# Patient Record
Sex: Male | Born: 1935 | Race: White | Hispanic: No | Smoking: Never smoker
Health system: Southern US, Community
[De-identification: ages and names within clinical notes are randomized; demographics above are authoritative.]

## PROBLEM LIST (undated history)

## (undated) DIAGNOSIS — F419 Anxiety disorder, unspecified: Secondary | ICD-10-CM

## (undated) DIAGNOSIS — I2510 Atherosclerotic heart disease of native coronary artery without angina pectoris: Secondary | ICD-10-CM

## (undated) DIAGNOSIS — M6281 Muscle weakness (generalized): Secondary | ICD-10-CM

## (undated) DIAGNOSIS — F329 Major depressive disorder, single episode, unspecified: Secondary | ICD-10-CM

## (undated) DIAGNOSIS — E119 Type 2 diabetes mellitus without complications: Secondary | ICD-10-CM

## (undated) DIAGNOSIS — I219 Acute myocardial infarction, unspecified: Secondary | ICD-10-CM

## (undated) DIAGNOSIS — F32A Depression, unspecified: Secondary | ICD-10-CM

## (undated) DIAGNOSIS — I4891 Unspecified atrial fibrillation: Secondary | ICD-10-CM

## (undated) DIAGNOSIS — I509 Heart failure, unspecified: Secondary | ICD-10-CM

## (undated) DIAGNOSIS — C4490 Unspecified malignant neoplasm of skin, unspecified: Secondary | ICD-10-CM

## (undated) DIAGNOSIS — N320 Bladder-neck obstruction: Secondary | ICD-10-CM

## (undated) DIAGNOSIS — I429 Cardiomyopathy, unspecified: Secondary | ICD-10-CM

## (undated) DIAGNOSIS — N189 Chronic kidney disease, unspecified: Secondary | ICD-10-CM

## (undated) HISTORY — PX: CORONARY ANGIOPLASTY: SHX604

## (undated) HISTORY — PX: CATARACT EXTRACTION: SUR2

## (undated) HISTORY — DX: Depression, unspecified: F32.A

## (undated) HISTORY — DX: Muscle weakness (generalized): M62.81

## (undated) HISTORY — DX: Unspecified malignant neoplasm of skin, unspecified: C44.90

## (undated) HISTORY — DX: Unspecified atrial fibrillation: I48.91

## (undated) HISTORY — DX: Major depressive disorder, single episode, unspecified: F32.9

## (undated) HISTORY — PX: LAMINECTOMY: SHX219

## (undated) HISTORY — DX: Heart failure, unspecified: I50.9

## (undated) HISTORY — PX: SKIN CANCER EXCISION: SHX779

## (undated) HISTORY — DX: Bladder-neck obstruction: N32.0

## (undated) HISTORY — DX: Chronic kidney disease, unspecified: N18.9

## (undated) HISTORY — DX: Acute myocardial infarction, unspecified: I21.9

## (undated) HISTORY — PX: APPENDECTOMY: SHX54

## (undated) HISTORY — DX: Anxiety disorder, unspecified: F41.9

## (undated) HISTORY — DX: Atherosclerotic heart disease of native coronary artery without angina pectoris: I25.10

## (undated) HISTORY — DX: Cardiomyopathy, unspecified: I42.9

## (undated) HISTORY — PX: SUPRAPUBIC CATHETER PLACEMENT: SHX2473

---

## 1987-03-27 HISTORY — PX: CORONARY ARTERY BYPASS GRAFT: SHX141

## 2004-05-12 ENCOUNTER — Inpatient Hospital Stay: Payer: Self-pay | Admitting: Surgery

## 2004-05-12 ENCOUNTER — Other Ambulatory Visit: Payer: Self-pay

## 2004-05-15 ENCOUNTER — Other Ambulatory Visit: Payer: Self-pay

## 2004-06-02 ENCOUNTER — Inpatient Hospital Stay: Payer: Self-pay | Admitting: Internal Medicine

## 2004-06-05 ENCOUNTER — Emergency Department: Payer: Self-pay | Admitting: Emergency Medicine

## 2004-06-05 ENCOUNTER — Other Ambulatory Visit: Payer: Self-pay

## 2005-10-08 ENCOUNTER — Ambulatory Visit: Payer: Self-pay | Admitting: Internal Medicine

## 2008-02-17 ENCOUNTER — Ambulatory Visit: Payer: Self-pay | Admitting: Ophthalmology

## 2008-03-01 ENCOUNTER — Ambulatory Visit: Payer: Self-pay | Admitting: Ophthalmology

## 2008-10-29 ENCOUNTER — Ambulatory Visit: Payer: Self-pay | Admitting: Physician Assistant

## 2008-11-05 ENCOUNTER — Ambulatory Visit: Payer: Self-pay | Admitting: Internal Medicine

## 2008-11-10 ENCOUNTER — Ambulatory Visit: Payer: Self-pay | Admitting: Internal Medicine

## 2009-05-09 ENCOUNTER — Inpatient Hospital Stay: Payer: Self-pay | Admitting: Internal Medicine

## 2009-05-27 ENCOUNTER — Ambulatory Visit: Payer: Self-pay | Admitting: Internal Medicine

## 2010-02-07 ENCOUNTER — Inpatient Hospital Stay: Payer: Self-pay | Admitting: Internal Medicine

## 2010-02-14 ENCOUNTER — Emergency Department: Payer: Self-pay

## 2010-12-04 ENCOUNTER — Ambulatory Visit: Payer: Self-pay | Admitting: Internal Medicine

## 2012-08-20 DIAGNOSIS — N183 Chronic kidney disease, stage 3 (moderate): Secondary | ICD-10-CM

## 2013-06-28 ENCOUNTER — Emergency Department: Payer: Self-pay | Admitting: Emergency Medicine

## 2013-06-28 LAB — CBC
HCT: 34.9 % — ABNORMAL LOW (ref 40.0–52.0)
HGB: 11.5 g/dL — AB (ref 13.0–18.0)
MCH: 30.6 pg (ref 26.0–34.0)
MCHC: 32.8 g/dL (ref 32.0–36.0)
MCV: 93 fL (ref 80–100)
Platelet: 169 10*3/uL (ref 150–440)
RBC: 3.74 10*6/uL — AB (ref 4.40–5.90)
RDW: 15.4 % — ABNORMAL HIGH (ref 11.5–14.5)
WBC: 6 10*3/uL (ref 3.8–10.6)

## 2013-06-28 LAB — URINALYSIS, COMPLETE
Bacteria: NONE SEEN
Bilirubin,UR: NEGATIVE
KETONE: NEGATIVE
LEUKOCYTE ESTERASE: NEGATIVE
Nitrite: NEGATIVE
PH: 5 (ref 4.5–8.0)
Protein: 30
SPECIFIC GRAVITY: 1.012 (ref 1.003–1.030)
SQUAMOUS EPITHELIAL: NONE SEEN
WBC UR: 7 /HPF (ref 0–5)

## 2013-06-28 LAB — BASIC METABOLIC PANEL
Anion Gap: 5 — ABNORMAL LOW (ref 7–16)
BUN: 38 mg/dL — ABNORMAL HIGH (ref 7–18)
CHLORIDE: 107 mmol/L (ref 98–107)
CREATININE: 1.34 mg/dL — AB (ref 0.60–1.30)
Calcium, Total: 9.6 mg/dL (ref 8.5–10.1)
Co2: 26 mmol/L (ref 21–32)
EGFR (African American): 54 — ABNORMAL LOW
EGFR (Non-African Amer.): 46 — ABNORMAL LOW
GLUCOSE: 190 mg/dL — AB (ref 65–99)
Osmolality: 290 (ref 275–301)
Potassium: 5.6 mmol/L — ABNORMAL HIGH (ref 3.5–5.1)
Sodium: 138 mmol/L (ref 136–145)

## 2013-07-26 DIAGNOSIS — E785 Hyperlipidemia, unspecified: Secondary | ICD-10-CM | POA: Insufficient documentation

## 2013-08-22 DIAGNOSIS — I4891 Unspecified atrial fibrillation: Secondary | ICD-10-CM | POA: Insufficient documentation

## 2013-08-22 DIAGNOSIS — I2510 Atherosclerotic heart disease of native coronary artery without angina pectoris: Secondary | ICD-10-CM | POA: Insufficient documentation

## 2013-08-22 DIAGNOSIS — I10 Essential (primary) hypertension: Secondary | ICD-10-CM | POA: Insufficient documentation

## 2013-10-30 DIAGNOSIS — E1142 Type 2 diabetes mellitus with diabetic polyneuropathy: Secondary | ICD-10-CM | POA: Insufficient documentation

## 2013-11-24 ENCOUNTER — Inpatient Hospital Stay: Payer: Self-pay | Admitting: Internal Medicine

## 2013-11-24 LAB — PHOSPHORUS: Phosphorus: 1.6 mg/dL — ABNORMAL LOW (ref 2.5–4.9)

## 2013-11-24 LAB — URINALYSIS, COMPLETE
BACTERIA: NONE SEEN
Bilirubin,UR: NEGATIVE
Leukocyte Esterase: NEGATIVE
Nitrite: NEGATIVE
Ph: 5 (ref 4.5–8.0)
SQUAMOUS EPITHELIAL: NONE SEEN
Specific Gravity: 1.013 (ref 1.003–1.030)
WBC UR: 2 /HPF (ref 0–5)

## 2013-11-24 LAB — HEPATIC FUNCTION PANEL A (ARMC)
Albumin: 3 g/dL — ABNORMAL LOW (ref 3.4–5.0)
Alkaline Phosphatase: 67 U/L
Bilirubin, Direct: 0.1 mg/dL (ref 0.00–0.20)
Bilirubin,Total: 0.4 mg/dL (ref 0.2–1.0)
SGOT(AST): 19 U/L (ref 15–37)
SGPT (ALT): 22 U/L
Total Protein: 6.3 g/dL — ABNORMAL LOW (ref 6.4–8.2)

## 2013-11-24 LAB — CBC
HCT: 33.3 % — AB (ref 40.0–52.0)
HGB: 11 g/dL — AB (ref 13.0–18.0)
MCH: 30.7 pg (ref 26.0–34.0)
MCHC: 33.1 g/dL (ref 32.0–36.0)
MCV: 93 fL (ref 80–100)
Platelet: 136 10*3/uL — ABNORMAL LOW (ref 150–440)
RBC: 3.59 10*6/uL — AB (ref 4.40–5.90)
RDW: 14.4 % (ref 11.5–14.5)
WBC: 6.3 10*3/uL (ref 3.8–10.6)

## 2013-11-24 LAB — BASIC METABOLIC PANEL
ANION GAP: 10 (ref 7–16)
BUN: 43 mg/dL — ABNORMAL HIGH (ref 7–18)
CHLORIDE: 107 mmol/L (ref 98–107)
CO2: 18 mmol/L — AB (ref 21–32)
CREATININE: 1.59 mg/dL — AB (ref 0.60–1.30)
Calcium, Total: 9.2 mg/dL (ref 8.5–10.1)
EGFR (African American): 43 — ABNORMAL LOW
GFR CALC NON AF AMER: 37 — AB
GLUCOSE: 192 mg/dL — AB (ref 65–99)
Osmolality: 286 (ref 275–301)
Potassium: 5.4 mmol/L — ABNORMAL HIGH (ref 3.5–5.1)
Sodium: 135 mmol/L — ABNORMAL LOW (ref 136–145)

## 2013-11-24 LAB — TROPONIN I: TROPONIN-I: 0.03 ng/mL

## 2013-11-24 LAB — PROTIME-INR
INR: 1.2
PROTHROMBIN TIME: 14.6 s (ref 11.5–14.7)

## 2013-11-24 LAB — MAGNESIUM: MAGNESIUM: 1.5 mg/dL — AB

## 2013-11-25 LAB — CLOSTRIDIUM DIFFICILE(ARMC)

## 2013-11-25 LAB — BASIC METABOLIC PANEL
Anion Gap: 7 (ref 7–16)
BUN: 35 mg/dL — AB (ref 7–18)
CO2: 21 mmol/L (ref 21–32)
Calcium, Total: 7.8 mg/dL — ABNORMAL LOW (ref 8.5–10.1)
Chloride: 111 mmol/L — ABNORMAL HIGH (ref 98–107)
Creatinine: 1.4 mg/dL — ABNORMAL HIGH (ref 0.60–1.30)
GFR CALC AF AMER: 51 — AB
GFR CALC NON AF AMER: 44 — AB
Glucose: 163 mg/dL — ABNORMAL HIGH (ref 65–99)
OSMOLALITY: 289 (ref 275–301)
POTASSIUM: 5.1 mmol/L (ref 3.5–5.1)
SODIUM: 139 mmol/L (ref 136–145)

## 2013-11-26 LAB — CBC WITH DIFFERENTIAL/PLATELET
Basophil #: 0 10*3/uL (ref 0.0–0.1)
Basophil %: 0.4 %
EOS PCT: 3.3 %
Eosinophil #: 0.2 10*3/uL (ref 0.0–0.7)
HCT: 32.7 % — AB (ref 40.0–52.0)
HGB: 10.3 g/dL — ABNORMAL LOW (ref 13.0–18.0)
LYMPHS ABS: 0.8 10*3/uL — AB (ref 1.0–3.6)
LYMPHS PCT: 17.7 %
MCH: 29.7 pg (ref 26.0–34.0)
MCHC: 31.7 g/dL — ABNORMAL LOW (ref 32.0–36.0)
MCV: 94 fL (ref 80–100)
MONO ABS: 0.5 x10 3/mm (ref 0.2–1.0)
Monocyte %: 11.1 %
NEUTROS PCT: 67.5 %
Neutrophil #: 3.1 10*3/uL (ref 1.4–6.5)
Platelet: 126 10*3/uL — ABNORMAL LOW (ref 150–440)
RBC: 3.48 10*6/uL — ABNORMAL LOW (ref 4.40–5.90)
RDW: 14.7 % — ABNORMAL HIGH (ref 11.5–14.5)
WBC: 4.5 10*3/uL (ref 3.8–10.6)

## 2013-11-26 LAB — BASIC METABOLIC PANEL
Anion Gap: 5 — ABNORMAL LOW (ref 7–16)
BUN: 33 mg/dL — AB (ref 7–18)
CALCIUM: 8.1 mg/dL — AB (ref 8.5–10.1)
CHLORIDE: 115 mmol/L — AB (ref 98–107)
Co2: 22 mmol/L (ref 21–32)
Creatinine: 1.37 mg/dL — ABNORMAL HIGH (ref 0.60–1.30)
EGFR (Non-African Amer.): 45 — ABNORMAL LOW
GFR CALC AF AMER: 52 — AB
Glucose: 105 mg/dL — ABNORMAL HIGH (ref 65–99)
Osmolality: 291 (ref 275–301)
POTASSIUM: 4.5 mmol/L (ref 3.5–5.1)
Sodium: 142 mmol/L (ref 136–145)

## 2013-11-26 LAB — URINE CULTURE

## 2013-11-29 LAB — CULTURE, BLOOD (SINGLE)

## 2014-02-21 DIAGNOSIS — I700 Atherosclerosis of aorta: Secondary | ICD-10-CM | POA: Insufficient documentation

## 2014-04-16 ENCOUNTER — Inpatient Hospital Stay: Payer: Self-pay | Admitting: Internal Medicine

## 2014-04-16 LAB — COMPREHENSIVE METABOLIC PANEL
AST: 18 U/L (ref 15–37)
Albumin: 3.2 g/dL — ABNORMAL LOW (ref 3.4–5.0)
Alkaline Phosphatase: 71 U/L
Anion Gap: 8 (ref 7–16)
BILIRUBIN TOTAL: 0.4 mg/dL (ref 0.2–1.0)
BUN: 46 mg/dL — ABNORMAL HIGH (ref 7–18)
CALCIUM: 9.8 mg/dL (ref 8.5–10.1)
CO2: 23 mmol/L (ref 21–32)
Chloride: 109 mmol/L — ABNORMAL HIGH (ref 98–107)
Creatinine: 1.69 mg/dL — ABNORMAL HIGH (ref 0.60–1.30)
GFR CALC AF AMER: 49 — AB
GFR CALC NON AF AMER: 41 — AB
GLUCOSE: 87 mg/dL (ref 65–99)
OSMOLALITY: 291 (ref 275–301)
Potassium: 5.7 mmol/L — ABNORMAL HIGH (ref 3.5–5.1)
SGPT (ALT): 26 U/L
SODIUM: 140 mmol/L (ref 136–145)
Total Protein: 6.4 g/dL (ref 6.4–8.2)

## 2014-04-16 LAB — CBC
HCT: 34.8 % — ABNORMAL LOW (ref 40.0–52.0)
HGB: 11.1 g/dL — ABNORMAL LOW (ref 13.0–18.0)
MCH: 29.6 pg (ref 26.0–34.0)
MCHC: 31.8 g/dL — ABNORMAL LOW (ref 32.0–36.0)
MCV: 93 fL (ref 80–100)
PLATELETS: 150 10*3/uL (ref 150–440)
RBC: 3.74 10*6/uL — ABNORMAL LOW (ref 4.40–5.90)
RDW: 15 % — AB (ref 11.5–14.5)
WBC: 8.5 10*3/uL (ref 3.8–10.6)

## 2014-04-16 LAB — POTASSIUM: Potassium: 4.9 mmol/L (ref 3.5–5.1)

## 2014-04-16 LAB — PROTIME-INR
INR: 1
PROTHROMBIN TIME: 13.3 s (ref 11.5–14.7)

## 2014-04-16 LAB — TROPONIN I
TROPONIN-I: 0.03 ng/mL
Troponin-I: 0.03 ng/mL

## 2014-04-16 LAB — PRO B NATRIURETIC PEPTIDE: B-TYPE NATIURETIC PEPTID: 3168 pg/mL — AB (ref 0–450)

## 2014-04-16 LAB — URINALYSIS, COMPLETE
Bacteria: NONE SEEN
Bilirubin,UR: NEGATIVE
Blood: NEGATIVE
Glucose,UR: NEGATIVE mg/dL (ref 0–75)
KETONE: NEGATIVE
Leukocyte Esterase: NEGATIVE
Nitrite: NEGATIVE
Ph: 5 (ref 4.5–8.0)
SPECIFIC GRAVITY: 1.017 (ref 1.003–1.030)
SQUAMOUS EPITHELIAL: NONE SEEN

## 2014-04-16 LAB — URIC ACID: URIC ACID: 5.3 mg/dL (ref 3.5–7.2)

## 2014-04-16 LAB — APTT: ACTIVATED PTT: 29.5 s (ref 23.6–35.9)

## 2014-04-16 LAB — D-DIMER(ARMC): D-Dimer: 2824 ng/ml

## 2014-04-17 LAB — CBC WITH DIFFERENTIAL/PLATELET
BASOS PCT: 0.7 %
Basophil #: 0.1 10*3/uL (ref 0.0–0.1)
EOS PCT: 0.3 %
Eosinophil #: 0 10*3/uL (ref 0.0–0.7)
HCT: 37.1 % — ABNORMAL LOW (ref 40.0–52.0)
HGB: 12.1 g/dL — AB (ref 13.0–18.0)
LYMPHS ABS: 1.1 10*3/uL (ref 1.0–3.6)
Lymphocyte %: 10.8 %
MCH: 30 pg (ref 26.0–34.0)
MCHC: 32.8 g/dL (ref 32.0–36.0)
MCV: 92 fL (ref 80–100)
MONO ABS: 0.9 x10 3/mm (ref 0.2–1.0)
MONOS PCT: 9 %
NEUTROS ABS: 7.9 10*3/uL — AB (ref 1.4–6.5)
Neutrophil %: 79.2 %
Platelet: 154 10*3/uL (ref 150–440)
RBC: 4.05 10*6/uL — ABNORMAL LOW (ref 4.40–5.90)
RDW: 15.3 % — ABNORMAL HIGH (ref 11.5–14.5)
WBC: 10 10*3/uL (ref 3.8–10.6)

## 2014-04-17 LAB — HEPARIN LEVEL (UNFRACTIONATED): ANTI-XA(UNFRACTIONATED): 0.37 [IU]/mL (ref 0.30–0.70)

## 2014-04-17 LAB — BASIC METABOLIC PANEL
Anion Gap: 7 (ref 7–16)
BUN: 43 mg/dL — ABNORMAL HIGH (ref 7–18)
CHLORIDE: 105 mmol/L (ref 98–107)
CREATININE: 1.74 mg/dL — AB (ref 0.60–1.30)
Calcium, Total: 9.8 mg/dL (ref 8.5–10.1)
Co2: 27 mmol/L (ref 21–32)
EGFR (African American): 48 — ABNORMAL LOW
EGFR (Non-African Amer.): 39 — ABNORMAL LOW
GLUCOSE: 155 mg/dL — AB (ref 65–99)
OSMOLALITY: 292 (ref 275–301)
POTASSIUM: 4.6 mmol/L (ref 3.5–5.1)
Sodium: 139 mmol/L (ref 136–145)

## 2014-04-17 LAB — LIPID PANEL
Cholesterol: 117 mg/dL (ref 0–200)
HDL Cholesterol: 36 mg/dL — ABNORMAL LOW (ref 40–60)
Ldl Cholesterol, Calc: 58 mg/dL (ref 0–100)
Triglycerides: 115 mg/dL (ref 0–200)
VLDL Cholesterol, Calc: 23 mg/dL (ref 5–40)

## 2014-04-17 LAB — MAGNESIUM: Magnesium: 2 mg/dL

## 2014-04-17 LAB — HEMOGLOBIN A1C: Hemoglobin A1C: 8.2 % — ABNORMAL HIGH (ref 4.2–6.3)

## 2014-04-17 LAB — TSH: Thyroid Stimulating Horm: 1.22 u[IU]/mL

## 2014-04-18 LAB — CBC WITH DIFFERENTIAL/PLATELET
Basophil #: 0 10*3/uL (ref 0.0–0.1)
Basophil %: 0.5 %
EOS ABS: 0.1 10*3/uL (ref 0.0–0.7)
Eosinophil %: 1.6 %
HCT: 37.1 % — ABNORMAL LOW (ref 40.0–52.0)
HGB: 12 g/dL — AB (ref 13.0–18.0)
LYMPHS PCT: 13.1 %
Lymphocyte #: 1.1 10*3/uL (ref 1.0–3.6)
MCH: 29.6 pg (ref 26.0–34.0)
MCHC: 32.4 g/dL (ref 32.0–36.0)
MCV: 91 fL (ref 80–100)
MONOS PCT: 10 %
Monocyte #: 0.8 x10 3/mm (ref 0.2–1.0)
NEUTROS ABS: 6 10*3/uL (ref 1.4–6.5)
NEUTROS PCT: 74.8 %
Platelet: 153 10*3/uL (ref 150–440)
RBC: 4.07 10*6/uL — ABNORMAL LOW (ref 4.40–5.90)
RDW: 15.1 % — AB (ref 11.5–14.5)
WBC: 8 10*3/uL (ref 3.8–10.6)

## 2014-04-18 LAB — BASIC METABOLIC PANEL
ANION GAP: 7 (ref 7–16)
BUN: 44 mg/dL — ABNORMAL HIGH (ref 7–18)
CALCIUM: 9.4 mg/dL (ref 8.5–10.1)
Chloride: 102 mmol/L (ref 98–107)
Co2: 29 mmol/L (ref 21–32)
Creatinine: 1.94 mg/dL — ABNORMAL HIGH (ref 0.60–1.30)
GFR CALC AF AMER: 42 — AB
GFR CALC NON AF AMER: 35 — AB
GLUCOSE: 173 mg/dL — AB (ref 65–99)
Osmolality: 291 (ref 275–301)
Potassium: 4.2 mmol/L (ref 3.5–5.1)
SODIUM: 138 mmol/L (ref 136–145)

## 2014-04-18 LAB — URIC ACID: Uric Acid: 6 mg/dL (ref 3.5–7.2)

## 2014-04-19 LAB — CBC WITH DIFFERENTIAL/PLATELET
Basophil #: 0 10*3/uL (ref 0.0–0.1)
Basophil %: 0.4 %
EOS ABS: 0.2 10*3/uL (ref 0.0–0.7)
Eosinophil %: 1.7 %
HCT: 38.5 % — ABNORMAL LOW (ref 40.0–52.0)
HGB: 12.5 g/dL — AB (ref 13.0–18.0)
LYMPHS ABS: 1.3 10*3/uL (ref 1.0–3.6)
Lymphocyte %: 12.4 %
MCH: 29.8 pg (ref 26.0–34.0)
MCHC: 32.4 g/dL (ref 32.0–36.0)
MCV: 92 fL (ref 80–100)
MONO ABS: 1 x10 3/mm (ref 0.2–1.0)
Monocyte %: 10 %
Neutrophil #: 7.8 10*3/uL — ABNORMAL HIGH (ref 1.4–6.5)
Neutrophil %: 75.5 %
Platelet: 173 10*3/uL (ref 150–440)
RBC: 4.19 10*6/uL — ABNORMAL LOW (ref 4.40–5.90)
RDW: 14.9 % — ABNORMAL HIGH (ref 11.5–14.5)
WBC: 10.3 10*3/uL (ref 3.8–10.6)

## 2014-04-19 LAB — BASIC METABOLIC PANEL
ANION GAP: 10 (ref 7–16)
BUN: 60 mg/dL — ABNORMAL HIGH (ref 7–18)
CO2: 24 mmol/L (ref 21–32)
Calcium, Total: 9 mg/dL (ref 8.5–10.1)
Chloride: 99 mmol/L (ref 98–107)
Creatinine: 2.68 mg/dL — ABNORMAL HIGH (ref 0.60–1.30)
EGFR (African American): 29 — ABNORMAL LOW
GFR CALC NON AF AMER: 24 — AB
GLUCOSE: 214 mg/dL — AB (ref 65–99)
Osmolality: 290 (ref 275–301)
Potassium: 4.6 mmol/L (ref 3.5–5.1)
Sodium: 133 mmol/L — ABNORMAL LOW (ref 136–145)

## 2014-04-20 LAB — CBC WITH DIFFERENTIAL/PLATELET
BASOS PCT: 0.4 %
Basophil #: 0 10*3/uL (ref 0.0–0.1)
EOS ABS: 0.1 10*3/uL (ref 0.0–0.7)
Eosinophil %: 1.7 %
HCT: 36.1 % — ABNORMAL LOW (ref 40.0–52.0)
HGB: 11.6 g/dL — AB (ref 13.0–18.0)
LYMPHS PCT: 11.3 %
Lymphocyte #: 1 10*3/uL (ref 1.0–3.6)
MCH: 29.3 pg (ref 26.0–34.0)
MCHC: 32.1 g/dL (ref 32.0–36.0)
MCV: 91 fL (ref 80–100)
Monocyte #: 0.7 x10 3/mm (ref 0.2–1.0)
Monocyte %: 8.8 %
Neutrophil #: 6.6 10*3/uL — ABNORMAL HIGH (ref 1.4–6.5)
Neutrophil %: 77.8 %
Platelet: 155 10*3/uL (ref 150–440)
RBC: 3.95 10*6/uL — ABNORMAL LOW (ref 4.40–5.90)
RDW: 14.9 % — ABNORMAL HIGH (ref 11.5–14.5)
WBC: 8.5 10*3/uL (ref 3.8–10.6)

## 2014-04-20 LAB — BASIC METABOLIC PANEL
Anion Gap: 10 (ref 7–16)
BUN: 66 mg/dL — ABNORMAL HIGH (ref 7–18)
CALCIUM: 8.9 mg/dL (ref 8.5–10.1)
Chloride: 103 mmol/L (ref 98–107)
Co2: 25 mmol/L (ref 21–32)
Creatinine: 2.45 mg/dL — ABNORMAL HIGH (ref 0.60–1.30)
EGFR (Non-African Amer.): 26 — ABNORMAL LOW
GFR CALC AF AMER: 32 — AB
Glucose: 99 mg/dL (ref 65–99)
Osmolality: 295 (ref 275–301)
POTASSIUM: 4.3 mmol/L (ref 3.5–5.1)
Sodium: 138 mmol/L (ref 136–145)

## 2014-04-21 ENCOUNTER — Encounter: Payer: Self-pay | Admitting: Internal Medicine

## 2014-04-26 ENCOUNTER — Encounter: Payer: Self-pay | Admitting: Internal Medicine

## 2014-04-26 LAB — URINALYSIS, COMPLETE
BACTERIA: NONE SEEN
Bilirubin,UR: NEGATIVE
Ketone: NEGATIVE
Leukocyte Esterase: NEGATIVE
NITRITE: NEGATIVE
Ph: 5 (ref 4.5–8.0)
Protein: 30
RBC,UR: 11 /HPF (ref 0–5)
Specific Gravity: 1.016 (ref 1.003–1.030)
Squamous Epithelial: <1
WBC UR: <1 /HPF (ref 0–5)

## 2014-04-28 LAB — URINE CULTURE

## 2014-05-05 ENCOUNTER — Ambulatory Visit: Payer: Self-pay | Admitting: Family

## 2014-05-07 ENCOUNTER — Emergency Department: Payer: Self-pay | Admitting: Emergency Medicine

## 2014-05-25 ENCOUNTER — Encounter
Admit: 2014-05-25 | Disposition: A | Discharge status: Home / Self Care | Payer: Self-pay | Attending: Internal Medicine | Admitting: Internal Medicine

## 2014-05-31 ENCOUNTER — Emergency Department: Payer: Self-pay | Admitting: Emergency Medicine

## 2014-06-02 ENCOUNTER — Emergency Department: Payer: Self-pay | Admitting: Emergency Medicine

## 2014-06-08 ENCOUNTER — Emergency Department: Payer: Self-pay | Admitting: Emergency Medicine

## 2014-06-09 ENCOUNTER — Emergency Department: Payer: Self-pay | Admitting: Emergency Medicine

## 2014-06-13 ENCOUNTER — Emergency Department: Payer: Self-pay | Admitting: Emergency Medicine

## 2014-06-16 ENCOUNTER — Emergency Department: Payer: Self-pay | Admitting: Emergency Medicine

## 2014-06-16 LAB — URINALYSIS, COMPLETE
BILIRUBIN, UR: NEGATIVE
Bacteria: NONE SEEN
Glucose,UR: >=500 mg/dL (ref 0–75)
KETONE: NEGATIVE
Nitrite: NEGATIVE
Ph: 5 (ref 4.5–8.0)
SPECIFIC GRAVITY: 1.017 (ref 1.003–1.030)
SQUAMOUS EPITHELIAL: NONE SEEN
WBC UR: 354 /HPF (ref 0–5)

## 2014-06-25 ENCOUNTER — Encounter
Admit: 2014-06-25 | Disposition: A | Discharge status: Home / Self Care | Payer: Self-pay | Attending: Internal Medicine | Admitting: Internal Medicine

## 2014-07-02 DIAGNOSIS — M48061 Spinal stenosis, lumbar region without neurogenic claudication: Secondary | ICD-10-CM | POA: Insufficient documentation

## 2014-07-02 DIAGNOSIS — M5116 Intervertebral disc disorders with radiculopathy, lumbar region: Secondary | ICD-10-CM | POA: Insufficient documentation

## 2014-07-17 ENCOUNTER — Emergency Department
Admit: 2014-07-17 | Disposition: A | Discharge status: Home / Self Care | Payer: Self-pay | Admitting: Emergency Medicine

## 2014-07-17 LAB — URINALYSIS, COMPLETE
BILIRUBIN, UR: NEGATIVE
Glucose,UR: >=500 mg/dL (ref 0–75)
KETONE: NEGATIVE
NITRITE: NEGATIVE
PH: 6 (ref 4.5–8.0)
RBC, UR: NONE SEEN /HPF (ref 0–5)
SPECIFIC GRAVITY: 1.016 (ref 1.003–1.030)
SQUAMOUS EPITHELIAL: NONE SEEN
WBC UR: NONE SEEN /HPF (ref 0–5)

## 2014-07-17 NOTE — Discharge Summary (Signed)
PATIENT NAME:  ABBAS, [name] MR#:  [id number] DATE OF BIRTH:  October 14, 1920  DATE OF ADMISSION:  11/24/2013 DATE OF PLANNED DISCHARGE:  11/26/2013   DISCHARGE DIAGNOSES:  1. Acute on chronic renal disease.  2. Dehydration.  3. Gastroenteritis.  4. Type 2 diabetes.  5. Hypertension.  6. Coronary artery disease.   DISCHARGE MEDICATIONS: Per [facility] med reconciliation. He basically will be on his usual home meds, holding further antibiotics with negative cultures and viral gastroenteritis-type syndrome.   HISTORY AND PHYSICAL: Please see detailed history and physical done on admission.   HOSPITAL COURSE: The patient was admitted with hypotension, rapidly corrected with IV fluids, which were continued. He seems to be at his baseline renal function with a GFR of 45 presently, came in at approximately 37. He had some mild hyperglycemia that improved. Blood cultures were negative at this point. Stool cultures were not done by the admitting physician, but turned around quickly enough that this was not likely Shigella, which would be the main need for further antibiotic treatment. C. difficile was, in fact, negative. White blood cell count was 6300 on admission, which is normal range. Minimal thrombocytopenia at 136. He had a mild anemia at 11, likely related to his renal disease. Troponins were negative. INR was normal. Liver functions were normal.   He will be discharged home to follow up in my office in approximately 1 week.  Please note it took approximately 35 minutes to do the discharge tasks today.   ____________________________ Ocie Cornfield. Ouida Sills, MD mwa:lb D: 11/26/2013 08:07:42 ET T: 11/26/2013 09:30:04 ET JOB#: [id number]  cc: Ocie Cornfield. Ouida Sills, MD, <Dictator> [name] MD ELECTRONICALLY SIGNED 12/04/2013 7:55

## 2014-07-17 NOTE — H&P (Signed)
PATIENT NAME:  [name], [name] MR#:  [id number] DATE OF BIRTH:  1920-12-26  DATE OF ADMISSION:  11/24/2013  REFERRING PHYSICIAN: Dr. Archie Balboa.   PRIMARY CARE PHYSICIAN: Frazier Richards, MD, [facility].   CHIEF COMPLAINT: Diarrhea.  HISTORY OF PRESENT ILLNESS: A 81 year old Caucasian male with history of type 2 diabetes uncomplicated, coronary artery disease status post CABG, atrial fibrillation not on any anticoagulants, presenting with diarrhea. Describes 1 day duration of diarrhea with about 6 bouts of watery stools, nonbloody, not mucoid without abdominal pain. Denies any recent antibiotics or travel. Denies any recent exposure. Denies any known close contacts with similar symptoms. Upon attempting to ambulate, it was noticed that he was lightheaded and weak, thus at the urgency of family, presented to the hospital for further workup and evaluation. Noted to have a fever on arrival, which the patient was unaware of and otherwise denies any further symptoms.  REVIEW OF SYSTEMS:  CONSTITUTIONAL: Positive for fatigue, weakness. Denies fevers, chills.  EYES: Denies blurred vision, double vision, eye pain. EARS, NOSE, THROAT: Denies tinnitus, ear pain, hearing loss. RESPIRATORY: Denies cough, wheeze, shortness of breath.  CARDIOVASCULAR: Denies chest pain, palpitations, edema. GASTROINTESTINAL: Denies any nausea, vomiting; however, positive for diarrhea. Denies any abdominal pain.  GENITOURINARY: Denies dysuria or hematuria. ENDOCRINOLOGY: Denies nocturia or  thyroid problems. HEMATOLOGY: Denies easy bruising or bleeding. SKIN: Denies rash or lesion. MUSCULOSKELETAL: Denies pain in neck, back, shoulders, knees, arthritic symptoms. NEUROLOGIC: Denies paralysis, paresthesias.  PSYCHIATRIC: Denies anxiety or depressive symptoms.  Otherwise, full review of systems performed by me is negative.   PAST MEDICAL HISTORY: Type 2 diabetes, non-insulin-requiring, uncomplicated; atrial  fibrillation not on any anticoagulants; coronary artery disease, status post CABG.   SOCIAL HISTORY: Denies any alcohol, tobacco or drug usage. Uses a walker for ambulation.  FAMILY HISTORY: Denies any known cardiovascular or pulmonary disorders.   ALLERGIES: AMBIEN, MORPHINE, PENICILLIN.   HOME MEDICATIONS: Include losartan 25 mg p.o. daily, metformin 1000 mg p.o. b.i.d., metoprolol succinate 25 mg p.o. daily as well as metoprolol tartrate 50 mg p.o. daily, Centrum Silver  multivitamin 1 tablet p.o. daily, PreserVision, Multivite 1 tablet daily, vitamin C 500 mg p.o. daily, vitamin D3 at 2000 international units p.o. daily.   PHYSICAL EXAMINATION:  VITAL SIGNS: Temperature 101.5 degrees Fahrenheit, heart rate 101, respirations 28, blood pressure 102/57, saturating 98% on room air. Weight 89.3 kg, BMI 26.  GENERAL: Well-nourished, well-developed Caucasian gentleman in no acute distress.  HEAD: Normocephalic, atraumatic.  EYES: Pupils equal, round, reactive to light. Extraocular muscles intact. No scleral icterus.  MOUTH: Dry mucous membranes. Dentition intact. No abscess noted.  EARS, NOSE, AND THROAT: Clear without exudates. No external lesions.  NECK: Supple. No thyromegaly. No nodules. No JVD.  PULMONARY: Clear to auscultation bilaterally without wheezes, rales, rhonchi. No use of accessory muscles. Good respiratory effort. Chest tender to palpation.  CARDIOVASCULAR: S1, S2. Irregular rate, irregular rhythm. No murmurs, rubs or gallops. Edema 2+ to the shins bilaterally. Pedal pulses 2+ bilaterally.  GASTROINTESTINAL: Soft, nontender, nondistended. No masses. Hyperactive bowel sounds. No hepatosplenomegaly.  MUSCULOSKELETAL: No swelling, clubbing. Positive edema as described above. Range of motion full in all extremities.  NEUROLOGIC: Cranial nerves II-XII intact. No gross focal or neurological deficits. Sensation intact. Reflexes intact. SKIN: No ulceration, lesion, rash, cyanosis. Skin  warm and dry. Turgor intact.  PSYCHIATRIC: Mood and affect within normal limits. Awake and oriented x 3. Insight and judgment intact.   LABORATORY DATA: Chest x-ray performed revealing no acute cardiopulmonary process.  Remainder of laboratory data: Sodium 135, potassium 5.4, chloride 108, bicarbonate 18, anion gap of 10, BUN 43, creatinine 1.59, glucose 192, magnesium 1.5. Troponin less than 0.03. WBC 6.8, hemoglobin 11, platelets of 136,000. Urinalysis negative for evidence of infection. Lactic acid 1.8.   ASSESSMENT AND PLAN: A 81 year old Caucasian gentleman with history of diabetes, uncomplicated, non-insulin-requiring, presenting with diarrhea.  1.  Sepsis: Meeting septic criteria by temperature, heart rate, respiratory rate; likely gastrointestinal etiology given diarrhea. Regardless, check blood cultures. Also check Clostridium difficile. Give Cipro, Flagyl. Follow culture data. Intravenous fluid hydration. Maintain pressure greater than 65.  2.  Acute kidney injury, most likely prerenal in nature given history. Provide IV fluid hydration. Follow urine output and renal function.  3.  Hyperkalemia without EKG changes in the setting of acute kidney injury. Provide IV fluid hydration and follow BMP and potassium levels, treating if greater than 5.5.  4.  Hypomagnesemia. Replace, magnesium goal of 2.  5.  Type 2 diabetes, uncomplicated. Hold p.o. agents. Add insulin sliding scale with q. 6 hour Accu-Cheks. 6.  Venous thromboembolism prophylaxis with heparin subcutaneous.  CODE STATUS: The patient is a  full code.  TIME SPENT: Forty-five minutes.   ____________________________ Aaron Mose. Janard Culp, MD dkh:TT D: 11/24/2013 40:98:11 ET T: 11/24/2013 22:56:59 ET JOB#: [id number]  cc: Aaron Mose. Tanyon Alipio, MD, <Dictator> Cerria Randhawa Woodfin Ganja MD ELECTRONICALLY SIGNED 11/25/2013 0:33

## 2014-07-25 NOTE — H&P (Signed)
PATIENT NAME:  [name], [name] MR#:  [id number] DATE OF BIRTH:  05/30/1920  DATE OF ADMISSION:  04/16/2014  PRIMARY CARE PHYSICIAN: Ocie Cornfield. Ouida Sills, MD  CHIEF COMPLAINT: Shortness of breath and weakness for the past 2 days.   HISTORY OF PRESENT ILLNESS: A 81 year old Caucasian male with a history of coronary artery disease, MI, and AFib, who presented to the ED with shortness of breath and weakness for the past 2 days. The patient is alert, awake, oriented, in no acute distress. The patient said recently he feels weak, and got physical therapy. He feels very tired after his physical therapy. He even could not stand up. He also has had leg swelling for the past several weeks. He developed shortness of breath for the past 2 days, but the patient denies any fever or chills. No chest pain or palpitations. No orthopnea or nocturnal dyspnea. The patient recently had left ankle gout and was treated with gout medication. Left ankle pain improved. The patient denies any other symptoms. No history of recent long distance travel. The patient was found to have an elevated d-dimer, more than 2000. Dr. Ferman Hamming,  ED physician ordered a V/Q scan.   PAST MEDICAL HISTORY: Coronary artery disease, status post CABG, MI, diabetes, AFib, not on any anticoagulation; melanoma skin cancer, recently had skin cancer removal.   SOCIAL HISTORY: No smoking, alcohol drinking or illicit drugs. 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   FAMILY HISTORY: No cardiovascular or pulmonary disorders.   ALLERGIES: AMBIEN, MORPHINE, PENICILLIN.   HOME MEDICATIONS: Vitamin D3 2000 units once a day p.r.n., Zocor 40 mg p.o. at bedtime PreserVision, AREDS, 2 antioxidant multivitamins with minerals oral capsule 1 capsule b.i.d.,  Lopressor 50 mg p.o. once a day and 25 mg at bedtime, metformin 1000 mg p.o. b.i.d., losartan 25 mg p.o. daily, glyburide 1 mg p.o. daily, Centrum Silver 1 tablet  once a day, aspirin 325 mg p.o. daily.   REVIEW OF SYSTEMS:  CONSTITUTIONAL: The patient denies any fever or chills. No headache or dizziness, but has generalized weakness.  EYES: No double no blurred vision.   EARS, NOSE, AND THROAT: No postnasal drip, slurred speech or dysphagia.  CARDIOVASCULAR: No chest pain, palpitation. No orthopnea, or nocturnal dyspnea, but has leg edema.  PULMONARY: Positive for shortness of breath. No cough or sputum. No wheezing or hemoptysis.  GASTROINTESTINAL: No abdominal pain, nausea, vomiting, diarrhea. No melena or bloody stool.  GENITOURINARY: No dysuria, hematuria, or incontinence.  SKIN: No rash or jaundice.  NEUROLOGIC: No syncope, loss of consciousness, or seizure.  ENDOCRINE: No polyuria, polydipsia, heat or cold intolerance.  HEMATOLOGY: No easy bleeding or bleeding.   PHYSICAL EXAMINATION:  VITAL SIGNS: Temperature 98, blood pressure 165/86, pulse 64, oxygen saturation 100% on room air.  GENERAL: The patient is alert, awake, oriented, in no acute distress.  HEENT: Pupils round, equal and reactive to light and accommodation. Moist oral mucosa. Clear oropharynx.  NECK: Supple. No JVD or carotid bruit. No lymphadenopathy. No thyromegaly.  CARDIOVASCULAR: S1, S2. Regular rate and rhythm. No murmurs or gallops, PULMONARY: Bilateral air entry. No wheezing or rales. No use of accessory muscles to breathe.  ABDOMEN: Soft. No distention or tenderness. No organomegaly. Bowel sounds present.  EXTREMITIES: Bilateral lower extremity edema, worse on the left side (2 plus). No calf tenderness. No clubbing or cyanosis. Bilateral pedal pulses present.  SKIN: No rash or jaundice.  NEUROLOGIC: A and O x 3. No  focal deficit. Power 4/5. Sensation intact.   LABORATORY DATA: Urinalysis showed RBC 16; otherwise, negative. Chest X-ray: No edema or consolidation. BNP 3168. Glucose 87, BUN 46, creatinine 1.69. Electrolytes are normal, except for potassium of 5.7, WBC 8.5,  hemoglobin 11.1, platelets 150,000. D-dimer 2824. Uric acid 5.3. INR 1.0. EKG shows sinus bradycardia at 56 beats per minute with first-degree AV block, V4 to V6 peak T-wave.   IMPRESSIONS:  1.  Possible acute congestive heart failure; need to rule out pulmonary embolus or deep venous thrombosis.  2.  Acute renal failure on chronic kidney disease.  3.  Hyperkalemia, possibly due to acute renal failure.  4.  History of coronary artery disease.  5.  History of atrial fibrillation.  6.  Anemia.   PLAN OF TREATMENT:  1.  The patient will be admitted to the telemetry floor. We will continue telemonitor. Start congestive heart failure protocol, start Lasix 20 mg IV b.i.d. and follow up echocardiograph and a cardiology consult from Dr. Ubaldo Glassing. In addition, we will follow up a V/Q scan and lower extremity duplex to rule out PE or DVT and we will start a heparin drip. If PE or DVT are negative, we will stop the heparin drip.  2.  For acute renal failure, we will hold metformin and losartan. Follow up BMP.  3.  Coronary artery disease. Will continue aspirin and Zocor. Follow up troponin.  4.  For diabetes, we will hold metformin and start sliding scale.  5.  I discussed the patient's condition and plan of treatment with the patient and the patient's son.   CODE STATUS: The patient wants DNR.   TIME SPENT: About 65 minutes.    ____________________________ Demetrios Loll, MD qc:MT D: 04/16/2014 15:50:59 ET T: 04/16/2014 16:19:47 ET JOB#: [id number]  cc: Demetrios Loll, MD, <Dictator> Demetrios Loll MD ELECTRONICALLY SIGNED 04/16/2014 18:39

## 2014-07-25 NOTE — Discharge Summary (Signed)
PATIENT NAME:  [name], [name] MR#:  [id number] DATE OF BIRTH:  04-17-1920  DATE OF ADMISSION:  04/16/2014 DATE OF DISCHARGE: 04/20/2014   DISCHARGE DIAGNOSES:  1.  Marked weakness, multifactorial.  2.  Cardiomyopathy.  3.  Acute on chronic renal failure.  4.  Bladder outlet obstruction with urinary incontinent of approximately 900 mL of urine.  5.  Recurrent atrial fibrillation post ablation procedure, now on Eliquis.   6.  Likely varicella-zoster on his left leg.  7.  Deep vein thrombosis in the left leg with a low prob V/Q scan.  8.  Type 2 diabetes with chronic kidney disease 3 concomitantly.   DISCHARGE MEDICATIONS: Per [facility] med reconciliation system.  Please see for details. Basically, he will be on Eliquis now with atrial fibrillation and DVT and we will stop his metformin given worsening renal function, try to substitute Januvia though Lantus worked very well at a low dose as needed.  He will be on higher dose metoprolol at this point as well.   HISTORY AND PHYSICAL: Please see detailed history and physical admission.   HOSPITAL COURSE: The patient was admitted feeling extremely poor, very weak, not able to ambulate, and had been previously.  He was found to have a DVT of his left leg with a low prob V/Q scan.  Then developed atrial fibrillation that was rapid time-controlled with diltiazem and metoprolol. He was hypotensive at times, often could not take the diltiazem.  His heart rate is now controlled after Foley catheter was placed.  It showed overflow urine in his bladder as noted above.  Creatinine at this point is 2.45 which is elevated from his baseline, more like 1.69, which it was on admission. He did have some furosemide for some mild pedal edema on admission, though I do not think this is frank congestive heart failure.  Chest x-ray did not show that either. Likely, he did have an obstructive uropathy.  Yesterday, the creatinine was 2.68.  He was felt to need further rehab, will  be transferred to [facility] for physical therapy and occupational therapy and further nursing care, etc.  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   TIME SPENT: It took approximately 35 minutes to do discharge.      ____________________________ Ocie Cornfield. Ouida Sills, MD mwa:DT D: 04/20/2014 07:50:00 ET T: 04/20/2014 08:09:06 ET JOB#: [id number]  cc: Ocie Cornfield. Ouida Sills, MD, <Dictator> [name] MD ELECTRONICALLY SIGNED 04/20/2014 13:11

## 2014-07-25 NOTE — Consult Note (Signed)
PATIENT NAME:  [name], [name] MR#:  [id number] DATE OF BIRTH:  December 07, 1920  DATE OF CONSULTATION:  04/17/2014  REFERRING PHYSICIAN:   CONSULTING PHYSICIAN:  Isaias Cowman, MD  PRIMARY CARE PHYSICIAN: Ocie Cornfield. Ouida Sills, MD  CARDIOLOGIST: Javier Docker. Ubaldo Glassing, MD  CHIEF COMPLAINT: Shortness of breath.   REASON FOR CONSULTATION: Consultation is requested for evaluation of shortness of breath and possible congestive heart failure.   HISTORY OF PRESENT ILLNESS: The patient is a 81 year old gentleman with known CAD, status post CABG, status post prior coronary stents, atrial fibrillation, status post prior catheter ablation, who presents with a 2-3 day history of shortness of breath and generalized weakness. The patient reports that he has been in his usual state of health with chronic peripheral edema. He was undergone physical therapy and exercise, at which time he developed weakness in both knees. Over the next 1-2 days, the patient reports that he had not felt well, noted increasing shortness of breath and presented to [facility].   The lab work was notable for a negative troponin. Ultrasound Doppler study of the lower extremities revealed nonocclusive DVT of the common femoral, which appeared chronic. V/Q scan showed low probability for pulmonary embolus. The patient was admitted to telemetry on a heparin drip. The patient currently denies chest pain. His breathing has improved on oxygen therapy. He has persistent peripheral edema, which is likely multifactorial.   PAST MEDICAL HISTORY:  1.  Coronary artery disease, status post CABG, status post coronary stents.  2.  Atrial fibrillation status post catheter ablation with a CHADS2 score of 3. 3.  Diabetes.  4.  History of melanoma.  5.  Diabetic neuropathy.   MEDICATIONS: Zocor 40 mg at bedtime, Lopressor 50 mg  q. a.m. and 25 mg q. p.m., losartan 25 mg daily, aspirin 325 mg daily, Vitamin D3 2000 daily p.r.n., metformin 1000 mg  b.i.d., Centrum Silver 1 daily.   SOCIAL HISTORY: The patient is a widower and currently lives alone. 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 He denies tobacco abuse.   FAMILY HISTORY: No immediate family history of coronary artery disease or myocardial infarction.   REVIEW OF SYSTEMS:  CONSTITUTIONAL: Generalized weakness.  EYES: No blurry vision.  EARS: No hearing loss.  RESPIRATORY: Shortness of breath as described above.  CARDIOVASCULAR: No chest pain.  GASTROINTESTINAL: No nausea, vomiting, or diarrhea.  GENITOURINARY: No dysuria or hematuria.  ENDOCRINE: No polyuria or polydipsia.  INTEGUMENTARY: The patient has some scattered bruises.  MUSCULOSKELETAL: No arthralgias or myalgias.  NEUROLOGICAL: No focal muscle weakness or numbness.  PSYCHOLOGICAL: No depression or anxiety.   PHYSICAL EXAMINATION:  VITAL SIGNS: Blood pressure 167/82, pulse 79, respirations 20, temperature 97.5, pulse oximetry 100%.  HEENT: Pupils equal and reactive to light and accommodation.  NECK: Supple without thyromegaly.  LUNGS: Clear.  HEART: Normal JVP. Normal PMI. Regular rate and rhythm. Normal S1, S2. No appreciable gallop, murmur, or rub.  ABDOMEN: Soft and nontender. Pulses were intact bilaterally.  EXTREMITIES: There was 1 to 2+ bilateral pedal edema.  MUSCULOSKELETAL: Normal muscle tone.  NEUROLOGIC: The patient is alert and oriented x 3. Motor and sensory are both grossly intact.   IMPRESSION: A 81 year old gentleman with known coronary artery disease, status post coronary artery bypass grafting, status post prior stents, currently without chest pain and negative troponin. The patient has a history of atrial fibrillation, status post catheter ablation with CHADS2 score of 3. The patient presents with shortness of breath  with low probability CT scan with evidence of at least a chronic femoral vein deep vein thrombosis. The patient is currently on a heparin  drip and aspirin.   RECOMMENDATIONS:  1.  Discontinue heparin drip.  2.  Discontinue aspirin.  3.  Start low dose apixaban 2.5 mg b.i.d.  4.  Review 2-D echocardiogram.  5.  Further recommendations pending echocardiogram results.    ____________________________ Isaias Cowman, MD ap:MT D: 04/17/2014 09:49:16 ET T: 04/17/2014 10:46:41 ET JOB#: [id number]  cc: Isaias Cowman, MD, <Dictator> Isaias Cowman MD ELECTRONICALLY SIGNED 05/18/2014 14:35

## 2014-07-26 ENCOUNTER — Other Ambulatory Visit: Payer: Self-pay | Admitting: Physical Medicine and Rehabilitation

## 2014-08-02 DIAGNOSIS — I5022 Chronic systolic (congestive) heart failure: Secondary | ICD-10-CM | POA: Insufficient documentation

## 2014-08-02 DIAGNOSIS — N320 Bladder-neck obstruction: Secondary | ICD-10-CM | POA: Insufficient documentation

## 2014-08-02 DIAGNOSIS — I950 Idiopathic hypotension: Secondary | ICD-10-CM

## 2014-08-02 DIAGNOSIS — I959 Hypotension, unspecified: Secondary | ICD-10-CM | POA: Insufficient documentation

## 2014-08-02 DIAGNOSIS — E119 Type 2 diabetes mellitus without complications: Secondary | ICD-10-CM | POA: Insufficient documentation

## 2014-08-13 ENCOUNTER — Encounter
Admission: RE | Admit: 2014-08-13 | Discharge: 2014-08-13 | Disposition: A | Discharge status: Home / Self Care | Payer: Medicare Other | Source: Ambulatory Visit | Attending: Internal Medicine | Admitting: Internal Medicine

## 2014-08-13 DIAGNOSIS — A4902 Methicillin resistant Staphylococcus aureus infection, unspecified site: Secondary | ICD-10-CM | POA: Insufficient documentation

## 2014-08-24 ENCOUNTER — Emergency Department
Admission: EM | Admit: 2014-08-24 | Discharge: 2014-08-24 | Disposition: A | Discharge status: Home / Self Care | Payer: Medicare Other | Attending: Emergency Medicine | Admitting: Emergency Medicine

## 2014-08-24 ENCOUNTER — Encounter: Payer: Self-pay | Admitting: Emergency Medicine

## 2014-08-24 DIAGNOSIS — R109 Unspecified abdominal pain: Secondary | ICD-10-CM | POA: Diagnosis present

## 2014-08-24 DIAGNOSIS — Z880 Allergy status to penicillin: Secondary | ICD-10-CM | POA: Insufficient documentation

## 2014-08-24 DIAGNOSIS — Z79899 Other long term (current) drug therapy: Secondary | ICD-10-CM | POA: Insufficient documentation

## 2014-08-24 DIAGNOSIS — N189 Chronic kidney disease, unspecified: Secondary | ICD-10-CM | POA: Insufficient documentation

## 2014-08-24 DIAGNOSIS — I129 Hypertensive chronic kidney disease with stage 1 through stage 4 chronic kidney disease, or unspecified chronic kidney disease: Secondary | ICD-10-CM | POA: Insufficient documentation

## 2014-08-24 DIAGNOSIS — N390 Urinary tract infection, site not specified: Secondary | ICD-10-CM | POA: Insufficient documentation

## 2014-08-24 DIAGNOSIS — E119 Type 2 diabetes mellitus without complications: Secondary | ICD-10-CM | POA: Insufficient documentation

## 2014-08-24 LAB — URINALYSIS COMPLETE WITH MICROSCOPIC (ARMC ONLY)
BILIRUBIN URINE: NEGATIVE
Bacteria, UA: NONE SEEN
GLUCOSE, UA: 150 mg/dL — AB
Ketones, ur: NEGATIVE mg/dL
NITRITE: NEGATIVE
PROTEIN: 100 mg/dL — AB
Specific Gravity, Urine: 1.008 (ref 1.005–1.030)
Squamous Epithelial / LPF: NONE SEEN
pH: 6 (ref 5.0–8.0)

## 2014-08-24 LAB — GLUCOSE, CAPILLARY: GLUCOSE-CAPILLARY: 136 mg/dL — AB (ref 65–99)

## 2014-08-24 MED ORDER — PHENAZOPYRIDINE HCL 200 MG PO TABS
200.0000 mg | ORAL_TABLET | Freq: Once | ORAL | Status: AC
  Administered 2014-08-24: 200 mg via ORAL

## 2014-08-24 MED ORDER — CEFTRIAXONE SODIUM IN DEXTROSE 20 MG/ML IV SOLN: 1.0000 g | Freq: Once | INTRAVENOUS | Status: DC

## 2014-08-24 MED ORDER — PHENAZOPYRIDINE HCL 200 MG PO TABS: 200.0000 mg | ORAL_TABLET | Freq: Three times a day (TID) | ORAL | Status: AC | PRN

## 2014-08-24 MED ORDER — CEFTRIAXONE SODIUM 1 G IJ SOLR
INTRAMUSCULAR | Status: AC
  Administered 2014-08-24: 1 g via INTRAMUSCULAR
  Filled 2014-08-24: qty 10

## 2014-08-24 MED ORDER — CEFTRIAXONE SODIUM 1 G IJ SOLR
1.0000 g | Freq: Once | INTRAMUSCULAR | Status: AC
  Administered 2014-08-24: 1 g via INTRAMUSCULAR

## 2014-08-24 MED ORDER — PHENAZOPYRIDINE HCL 200 MG PO TABS
ORAL_TABLET | ORAL | Status: AC
  Administered 2014-08-24: 200 mg via ORAL
  Filled 2014-08-24: qty 1

## 2014-08-24 MED ORDER — SULFAMETHOXAZOLE-TRIMETHOPRIM 800-160 MG PO TABS: 1.0000 | ORAL_TABLET | Freq: Two times a day (BID) | ORAL | Status: DC

## 2014-08-24 NOTE — Discharge Instructions (Signed)
Return to emergency department for any new or worsening abdominal pain, fever, nausea, vomiting, altered mental status or any other symptoms concerning to you.  Catheter-Associated Urinary Tract Infection FAQs WHAT IS "CATHETER-ASSOCIATED" URINARY TRACT INFECTION? A urinary tract infection (also called "UTI") is an infection in the urinary system, which includes the bladder (which stores the urine) and the kidneys (which filter the blood to make urine). Germs (for example, bacteria or yeasts) do not normally live in these areas; but if germs are introduced, an infection can occur. If you have a urinary catheter, germs can travel along the catheter and cause an infection in your bladder or your kidney; in that case it is called a catheter-associated urinary tract infection (or "CA-UTI").  WHAT IS A URINARY CATHETER? A urinary catheter is a thin tube placed in the bladder to drain urine. Urine drains through the tube into a bag that collects the urine. A urinary  catheter may be used:  If you are not able to urinate on your own.  To measure the amount of urine that you make, for example, during intensive care.  During and after some types of surgery.  During some tests of the kidneys and bladder . People with urinary catheters have a much higher chance of getting a urinary tract infection than people who don't have a catheter. HOW DO I GET A CATHETER-ASSOCIATED URINARY TRACT INFECTION (CA-UTI)? If germs enter the urinary tract, they may cause an infection. Many of the germs that cause a catheter-associated urinary tract infection are common germs found in your intestines that do not usually cause an infection there. Germs can enter the urinary tract when the catheter is being put in or while the catheter remains in the bladder.  WHAT ARE THE SYMPTOMS OF A URINARY TRACT INFECTION?  Some of the common symptoms of a urinary tract infection are:  Burning or pain in the lower abdomen (that is, below  the stomach).  Fever.  Bloody urine may be a sign of infection, but is also caused by other problems .  Burning during urination or an increase in the frequency of urination after the catheter is removed. Sometimes people with catheter-associated urinary tract infections do not have these symptoms of infection. CAN CATHETER-ASSOCIATED URINARY TRACT INFECTIONS BE TREATED? Yes, most catheter-associated urinary tract infections can be treated with antibiotics and removal or change of the catheter. Your doctor will determine which antibiotic is best for you.  WHAT ARE SOME OF THE THINGS THAT HOSPITALS ARE DOING TO PREVENT CATHETER-ASSOCIATED URINARY TRACT INFECTIONS? To prevent urinary tract infections, doctors and nurses take the following actions.  Catheter insertion  Catheters are put in only when necessary and they are removed as soon as possible.  Only properly trained persons insert catheters using sterile ("clean") technique.  The skin in the area where the catheter will be inserted is cleaned before inserting the catheter.  Other methods to drain the urine are sometimes used, such as:  External catheters in men (these look like condoms and are placed over the penis rather than into the penis)  Putting a temporary catheter in to drain the urine and removing it right away. This is called intermittent urethral catheterization. Catheter care  Healthcare providers clean their hands by washing them with soap and water or using an alcohol-based hand rub before and after touching your catheter.  If you do not see your providers clean their hands, please ask them to do so.  Avoid disconnecting the catheter and drain  tube. This helps to prevent germs from getting into the catheter tube.  The catheter is secured to the leg to prevent pulling on the catheter.  Avoid twisting or kinking the catheter.  Keep the bag lower than the bladder to prevent urine from backflowing to the  bladder.  Empty the bag regularly. The drainage spout should not touch anything while emptying the bag. WHAT CAN I DO TO HELP PREVENT CATHETER-ASSOCIATED URINARY TRACT INFECTIONS IF I HAVE A CATHETER?  Always clean your hands before and after doing catheter care.  Always keep your urine bag below the level of your bladder.  Do not tug or pull on the tubing.  Do not twist or kink the catheter tubing.  Ask your healthcare provider each day if you still need the catheter. WHAT DO I NEED TO DO WHEN I [place]?  If you will be going home with a catheter, your doctor or nurse should explain everything you need to know about taking care of the catheter. Make sure you understand how to care for it before you leave the hospital.  If you develop any of the symptoms of a urinary tract infection, such as burning or pain in the lower abdomen, fever, or an increase in the frequency of urination, contact your doctor or nurse immediately.  Before you go home, make sure you know who to contact if you have questions or problems after you get home. If you have questions, please ask your doctor or nurse. Developed and co-sponsored by [employer] for [place] [id number]); Infectious Diseases Society of [place] (IDSA); The [place]; Association for Professionals in Infection Control and Epidemiology (APIC); Center for Disease Control (CDC); and The Joint Commission Document Released: 12/05/2011 Document Reviewed: 12/05/2011 [facility] Patient Information 2015 [place]. This information is not intended to replace advice given to you by your health care provider. Make sure you discuss any questions you have with your health care provider.

## 2014-08-24 NOTE — ED Notes (Signed)

## 2014-08-24 NOTE — ED Provider Notes (Signed)
[facility] Emergency Department Provider Note   ____________________________________________  Time seen: 7:45 PM I have reviewed the triage vital signs and the triage nursing note.  HISTORY  Chief Complaint Abdominal Pain   Historian Patient was hard of hearing and his son  HPI [name] is a 81 y.o. male is complaining of leaking urine from his penis and bladder spasm/pain for about 2 days. He had a chronic indwelling Foley catheter which was changed over to a suprapubic catheter at the end of April of this year. For the past couple of days he's had a suprapubic pain and spasm which culminated with him passing some urine by his penis. He complains of the symptoms are moderate. He's not had any nausea, fever, altered mental status.    Past Medical History  Diagnosis Date  . Coronary artery disease   . Heart attack   . Diabetes mellitus without complication   . Atrial fibrillation   . Skin cancer   . Chronic kidney disease   . CHF (congestive heart failure)     Patient Active Problem List   Diagnosis Date Noted  . Chronic systolic heart failure 64/33/2951  . Diabetes 08/02/2014  . Hypotension 08/02/2014  . Bladder outlet obstruction 08/02/2014    Past Surgical History  Procedure Laterality Date  . Coronary artery bypass graft  1989  . Skin cancer excision    . Laminectomy    . Coronary angioplasty    . Cataract extraction Left     Current Outpatient Rx  Name  Route  Sig  Dispense  Refill  . acetaminophen (TYLENOL) 325 MG tablet   Oral   Take 650 mg by mouth every 4 (four) hours as needed for mild pain.          Marland Kitchen apixaban (ELIQUIS) 2.5 MG TABS tablet   Oral   Take 2.5 mg by mouth 2 (two) times daily.         . Cholecalciferol (VITAMIN D3) 2000 UNITS capsule   Oral   Take 2,000 Units by mouth daily.         Marland Kitchen docusate sodium (COLACE) 100 MG capsule   Oral   Take 100 mg by mouth 2 (two) times daily.         Marland Kitchen  lidocaine (LIDODERM) 5 %   Transdermal   Place 1 patch onto the skin daily. Remove & Discard patch within 12 hours or as directed by MD         . metoprolol succinate (TOPROL-XL) 50 MG 24 hr tablet   Oral   Take 50 mg by mouth daily.          . Multiple Vitamins-Minerals (PRESERVISION AREDS 2 PO)   Oral   Take 1 capsule by mouth 2 (two) times daily.         . Multiple Vitamins-Minerals (THEREMS-M PO)   Oral   Take 1 tablet by mouth daily.         Marland Kitchen senna (SENOKOT) 8.6 MG TABS tablet   Oral   Take 1 tablet by mouth 2 (two) times daily as needed for mild constipation.         . simvastatin (ZOCOR) 40 MG tablet   Oral   Take 40 mg by mouth at bedtime.          . sitaGLIPtin (JANUVIA) 100 MG tablet   Oral   Take 100 mg by mouth daily.         Marland Kitchen  traMADol (ULTRAM) 50 MG tablet   Oral   Take 50 mg by mouth every 6 (six) hours as needed.         . traZODone (DESYREL) 100 MG tablet   Oral   Take 100 mg by mouth at bedtime.         . insulin glargine (LANTUS) 100 UNIT/ML injection   Subcutaneous   Inject 12 Units into the skin at bedtime.          . phenazopyridine (PYRIDIUM) 200 MG tablet   Oral   Take 1 tablet (200 mg total) by mouth 3 (three) times daily as needed for pain.   10 tablet   0   . sulfamethoxazole-trimethoprim (BACTRIM DS,SEPTRA DS) 800-160 MG per tablet   Oral   Take 1 tablet by mouth 2 (two) times daily.   20 tablet   0     Allergies Ambien; Morphine and related; and Penicillins  No family history on file.  Social History History  Substance Use Topics  . Smoking status: Never Smoker   . Smokeless tobacco: Never Used  . Alcohol Use: No    Review of Systems  Constitutional: Negative for fever. Eyes: Negative for visual changes. ENT: Negative for sore throat. Chronic hard of hearing Cardiovascular: Negative for chest pain. Respiratory: Negative for shortness of breath. Gastrointestinal: Negative for abdominal pain,  vomiting and diarrhea. Genitourinary: Positive per history of present illness Musculoskeletal: Negative for back pain. Skin: Negative for rash. Neurological: Negative for headaches, focal weakness or numbness.  ____________________________________________   PHYSICAL EXAM:  VITAL SIGNS: ED Triage Vitals  Enc Vitals Group     BP 08/24/14 1704 129/78 mmHg     Pulse Rate 08/24/14 1704 67     Resp 08/24/14 1800 21     Temp 08/24/14 1704 97.7 F (36.5 C)     Temp Source 08/24/14 1704 Oral     SpO2 08/24/14 1704 100 %     Weight --      Height --      Head Cir --      Peak Flow --      Pain Score 08/24/14 1710 0     Pain Loc --      Pain Edu? --      Excl. in [place]? --      Constitutional: Alert and oriented. Well appearing and in no distress. Eyes: Conjunctivae are normal. PERRL. Normal extraocular movements. ENT   Head: Normocephalic and atraumatic.   Nose: No congestion/rhinnorhea.   Mouth/Throat: Mucous membranes are moist.   Neck: No stridor. Cardiovascular: Normal rate, regular rhythm.  No murmurs, rubs, or gallops. Respiratory: Normal respiratory effort without tachypnea nor retractions. Breath sounds are clear and equal bilaterally. No wheezes/rales/rhonchi. Gastrointestinal: Soft and nontender. No distention. Pubic catheter and place, with a small amount of granulation tissue at the insertion. No sialitis or abscess noted. No drainage from the catheter. Mild tenderness suprapubically on palpation but no guarding, and no rebound.  Genitourinary: Circumcised Musculoskeletal: Nontender with normal range of motion in all extremities. No joint effusions.  No lower extremity tenderness nor edema. Neurologic:  Normal speech and language. No gross focal neurologic deficits are appreciated. Skin:  Skin is warm, dry and intact. No rash noted. Psychiatric: Mood and affect are normal. Speech and behavior are normal. Patient exhibits appropriate insight and  judgment.  ____________________________________________   EKG  None ____________________________________________  LABS (pertinent positives/negatives)  Urinalysis turbid with 3+ leukocytes, 6-30 red blood cells, too  numerous to count white blood cells and no bacteria with white blood cell clumps  ____________________________________________  RADIOLOGY Radiologist results reviewed  None __________________________________________  PROCEDURES  Procedure(s) performed: None Critical Care performed: None  ____________________________________________   ED COURSE / ASSESSMENT AND PLAN  Pertinent labs & imaging results that were available during my care of the patient were reviewed by me and considered in my medical decision making (see chart for details).   Patient has symptoms concerning for a urinary tract infection and his urinalysis does have leukocytes red blood cells and white blood cells. There was no bacteria seen however given his symptoms and his catheter in place and we'll go ahead and start him on Rocephin injection in the emergency department followed by a prescription for Bactrim. He has no significant abdominal pain on exam and I do not suspect a surgical or other medical cause of his abdominal discomfort and urinary symptoms. A urine culture was sent. Return precautions and discharge instructions were provided to patient and his son.   ___________________________________________   FINAL CLINICAL IMPRESSION(S) / ED DIAGNOSES   Final diagnoses:  UTI (lower urinary tract infection)      Lisa Roca, MD 08/24/14 2012

## 2014-08-24 NOTE — ED Notes (Signed)
Ems from [facility] for abd pain. Pt with supra pubic cath placed end of April 2016. Area became painful, red and leaking for last two days

## 2014-08-27 LAB — URINE CULTURE: Culture: >=100000

## 2014-08-30 LAB — WOUND CULTURE

## 2014-08-31 ENCOUNTER — Emergency Department
Admission: EM | Admit: 2014-08-31 | Discharge: 2014-08-31 | Disposition: A | Discharge status: Home / Self Care | Payer: Medicare Other | Attending: Emergency Medicine | Admitting: Emergency Medicine

## 2014-08-31 ENCOUNTER — Encounter: Payer: Self-pay | Admitting: Emergency Medicine

## 2014-08-31 ENCOUNTER — Other Ambulatory Visit: Payer: Self-pay | Admitting: Internal Medicine

## 2014-08-31 ENCOUNTER — Other Ambulatory Visit: Payer: Self-pay | Admitting: Physical Medicine and Rehabilitation

## 2014-08-31 DIAGNOSIS — Z794 Long term (current) use of insulin: Secondary | ICD-10-CM | POA: Diagnosis not present

## 2014-08-31 DIAGNOSIS — Z79899 Other long term (current) drug therapy: Secondary | ICD-10-CM | POA: Insufficient documentation

## 2014-08-31 DIAGNOSIS — IMO0001 Reserved for inherently not codable concepts without codable children: Secondary | ICD-10-CM

## 2014-08-31 DIAGNOSIS — Z880 Allergy status to penicillin: Secondary | ICD-10-CM | POA: Insufficient documentation

## 2014-08-31 DIAGNOSIS — B9562 Methicillin resistant Staphylococcus aureus infection as the cause of diseases classified elsewhere: Principal | ICD-10-CM

## 2014-08-31 DIAGNOSIS — R339 Retention of urine, unspecified: Secondary | ICD-10-CM | POA: Insufficient documentation

## 2014-08-31 DIAGNOSIS — R338 Other retention of urine: Secondary | ICD-10-CM

## 2014-08-31 DIAGNOSIS — E119 Type 2 diabetes mellitus without complications: Secondary | ICD-10-CM | POA: Insufficient documentation

## 2014-08-31 DIAGNOSIS — M5417 Radiculopathy, lumbosacral region: Secondary | ICD-10-CM

## 2014-08-31 DIAGNOSIS — R1030 Lower abdominal pain, unspecified: Secondary | ICD-10-CM

## 2014-08-31 DIAGNOSIS — N189 Chronic kidney disease, unspecified: Secondary | ICD-10-CM | POA: Diagnosis not present

## 2014-08-31 DIAGNOSIS — T814XXA Infection following a procedure, initial encounter: Principal | ICD-10-CM

## 2014-08-31 NOTE — ED Provider Notes (Signed)
[facility] Emergency Department Provider Note  ____________________________________________  Time seen: Approximately 6:48 PM  I have reviewed the triage vital signs and the nursing notes.   HISTORY  Chief Complaint Urinary Retention    HPI [name] is a 81 y.o. male daylong history of urinary obstruction. At the end of April he had to have a suprapubic catheter placed by his doctor at [facility]. In addition, the patient has had issues with recent urinary tract infections. He is currently on antibiotic's at his nursing facility and followed closely by Dr. Frazier Richards. Today, during the afternoon patient noticed that he was having trouble urinating feeling a severe urge to urinate. However, he was only seen some slight leakage around his suprapubic catheter site of urine but no drainage into his collection bag. He denies any fevers or chills. He does have a severe urge to go in his lower abdomen. Denies nausea or vomiting. No chest pain.     Past Medical History  Diagnosis Date  . Coronary artery disease   . Heart attack   . Diabetes mellitus without complication   . Atrial fibrillation   . Skin cancer   . Chronic kidney disease   . CHF (congestive heart failure)     Patient Active Problem List   Diagnosis Date Noted  . Chronic systolic heart failure 82/42/3536  . Diabetes 08/02/2014  . Hypotension 08/02/2014  . Bladder outlet obstruction 08/02/2014    Past Surgical History  Procedure Laterality Date  . Coronary artery bypass graft  1989  . Skin cancer excision    . Laminectomy    . Coronary angioplasty    . Cataract extraction Left     Current Outpatient Rx  Name  Route  Sig  Dispense  Refill  . acetaminophen (TYLENOL) 325 MG tablet   Oral   Take 650 mg by mouth every 4 (four) hours as needed for mild pain.          Marland Kitchen apixaban (ELIQUIS) 2.5 MG TABS tablet   Oral   Take 2.5 mg by mouth 2 (two) times daily.         .  Cholecalciferol (VITAMIN D3) 2000 UNITS capsule   Oral   Take 2,000 Units by mouth daily.         Marland Kitchen docusate sodium (COLACE) 100 MG capsule   Oral   Take 100 mg by mouth 2 (two) times daily.         . insulin glargine (LANTUS) 100 UNIT/ML injection   Subcutaneous   Inject 12 Units into the skin at bedtime.          . lidocaine (LIDODERM) 5 %   Transdermal   Place 1 patch onto the skin daily. Remove & Discard patch within 12 hours or as directed by MD         . metoprolol succinate (TOPROL-XL) 50 MG 24 hr tablet   Oral   Take 50 mg by mouth daily.          . Multiple Vitamins-Minerals (PRESERVISION AREDS 2 PO)   Oral   Take 1 capsule by mouth 2 (two) times daily.         . Multiple Vitamins-Minerals (THEREMS-M PO)   Oral   Take 1 tablet by mouth daily.         . phenazopyridine (PYRIDIUM) 200 MG tablet   Oral   Take 1 tablet (200 mg total) by mouth 3 (three) times daily as needed for  pain.   10 tablet   0   . senna (SENOKOT) 8.6 MG TABS tablet   Oral   Take 1 tablet by mouth 2 (two) times daily as needed for mild constipation.         . simvastatin (ZOCOR) 40 MG tablet   Oral   Take 40 mg by mouth at bedtime.          . sitaGLIPtin (JANUVIA) 100 MG tablet   Oral   Take 100 mg by mouth daily.         Marland Kitchen sulfamethoxazole-trimethoprim (BACTRIM DS,SEPTRA DS) 800-160 MG per tablet   Oral   Take 1 tablet by mouth 2 (two) times daily.   20 tablet   0   . traMADol (ULTRAM) 50 MG tablet   Oral   Take 50 mg by mouth every 6 (six) hours as needed.         . traZODone (DESYREL) 100 MG tablet   Oral   Take 100 mg by mouth at bedtime.           Allergies Ambien; Morphine and related; and Penicillins  No family history on file.  Social History History  Substance Use Topics  . Smoking status: Never Smoker   . Smokeless tobacco: Never Used  . Alcohol Use: No    Review of Systems Constitutional: No fever/chills Eyes: No visual  changes. ENT: No sore throat. Cardiovascular: Denies chest pain. Respiratory: Denies shortness of breath. Gastrointestinal: No abdominal pain.  No nausea, no vomiting.  No diarrhea.  No constipation. Strong urge to urinate. Genitourinary: See history of present illness Musculoskeletal: Negative for back pain. Skin: Negative for rash. Neurological: Negative for headaches, focal weakness or numbness.  10-point ROS otherwise negative.  ____________________________________________   PHYSICAL EXAM:  VITAL SIGNS: ED Triage Vitals  Enc Vitals Group     BP 08/31/14 1822 134/65 mmHg     Pulse Rate 08/31/14 1822 78     Resp 08/31/14 1822 19     Temp 08/31/14 1819 98.1 F (36.7 C)     Temp Source 08/31/14 1819 Oral     SpO2 08/31/14 1822 100 %     Weight 08/31/14 1819 191 lb (86.637 kg)     Height 08/31/14 1819 6' (1.829 m)     Head Cir --      Peak Flow --      Pain Score 08/31/14 1820 5     Pain Loc --      Pain Edu? --      Excl. in [place]? --     Constitutional: Alert and oriented. Well appearing and in no acute distress. Eyes: Conjunctivae are normal. PERRL. EOMI. Head: Atraumatic. Nose: No congestion/rhinnorhea. Mouth/Throat: Mucous membranes are moist.  Oropharynx non-erythematous. Neck: No stridor.   Cardiovascular: Normal rate, regular rhythm. Grossly normal heart sounds.  Good peripheral circulation. Respiratory: Normal respiratory effort.  No retractions. Lungs CTAB. Gastrointestinal: Soft and nontender. The bladder is palpably distended, and palpation creates a strong urge to urinate. No pain. No abdominal bruits. No CVA tenderness. Suprapubic catheter is in place, but not draining. We evaluated the equipment and connections, we're unable to obtain blood urine flow. Unfortunately, it appears that his suprapubic catheter is blocked or clotted. He does have 2 descended testicles bilaterally. He has a circumcised normal-appearing penis. Musculoskeletal: No lower extremity  tenderness nor edema.  No joint effusions. Neurologic:  Normal speech and language. No gross focal neurologic deficits are appreciated. Speech is normal. No  gait instability. Skin:  Skin is warm, dry and intact. No rash noted. Psychiatric: Mood and affect are normal. Speech and behavior are normal.  ____________________________________________   LABS (all labs ordered are listed, but only abnormal results are displayed)  Labs Reviewed - No data to display ____________________________________________  EKG   ____________________________________________  RADIOLOGY   ____________________________________________   PROCEDURES  Procedure(s) performed: None  Critical Care performed: No  ____________________________________________   INITIAL IMPRESSION / ASSESSMENT AND PLAN / ED COURSE  Pertinent labs & imaging results that were available during my care of the patient were reviewed by me and considered in my medical decision making (see chart for details).  Acute urinary retention with a history of suprapubic catheter which now appears blocked. Unable to obtain urine flow despite evaluation of his tubing and catheter in the ER. We will insert a Foley catheter. He shows no signs of acute infection, he currently is on antibiotic for MRSA which was diagnosed about one week ago in his urine and under the care of Dr. Ouida Sills. If we are able to return and good urine and relieve symptoms after Foley insertion anticipate discharge to home with follow-up with Dr. Ouida Sills and his urologist. I discussed this plan with patient and his sister. ____________________________________________ ----------------------------------------- 7:09 PM on 08/31/2014 -----------------------------------------  Patient's bladder is treated proximally 450 cc of clear yellow urine . A Foley catheter. All the patient's symptoms are resolved. At this point, we will leave the Foley catheter in. The patient has close  follow-up with Dr. Frazier Richards and he is also setting up follow-up with [facility] urology as soon as possible.  I discussed with the patient and his family return precautions including recurrence of his symptoms today, inability urinate, fever, abdominal pain, weakness, or if other new concerns arise.  They're agreeable with plan and also follow-up with his regular physician and [facility].  FINAL CLINICAL IMPRESSION(S) / ED DIAGNOSES  Final diagnoses:  Acute urinary retention      Delman Kitten, MD 08/31/14 [phone]

## 2014-08-31 NOTE — ED Notes (Addendum)
Pt here via [facility] with c/o painful urination and urinary retention; pt seen here last week for urinary sx, pt with supra pubic catheter in place upon arrival.

## 2014-08-31 NOTE — Discharge Instructions (Signed)
Follow-up with urology at [facility] as soon as possible. Please call them tomorrow to discuss and make an appointment. In addition, please call Dr. Ouida Sills in the morning to notify him that you had to have an additional "foley catheter" inserted in the ER. He will need to assure follow-up.  Return to the ER right away if you redevelop symptoms of urinary obstruction/bladder blockage, have a fever, nausea, vomiting, severe abdominal pain, or if any other new concerns or symptoms arise.

## 2014-09-01 ENCOUNTER — Ambulatory Visit: Admission: RE | Admit: 2014-09-01 | Payer: Medicare Other | Source: Ambulatory Visit

## 2014-09-03 ENCOUNTER — Encounter
Admission: RE | Admit: 2014-09-03 | Discharge: 2014-09-03 | Disposition: A | Discharge status: Home / Self Care | Payer: Medicare Other | Source: Skilled Nursing Facility | Attending: Internal Medicine | Admitting: Internal Medicine

## 2014-09-03 ENCOUNTER — Encounter
Admission: RE | Admit: 2014-09-03 | Discharge: 2014-09-03 | Disposition: A | Discharge status: Home / Self Care | Payer: Medicare Other | Source: Ambulatory Visit | Attending: Internal Medicine | Admitting: Internal Medicine

## 2014-09-03 DIAGNOSIS — A4902 Methicillin resistant Staphylococcus aureus infection, unspecified site: Secondary | ICD-10-CM | POA: Insufficient documentation

## 2014-09-03 DIAGNOSIS — N189 Chronic kidney disease, unspecified: Secondary | ICD-10-CM | POA: Diagnosis present

## 2014-09-03 DIAGNOSIS — D649 Anemia, unspecified: Secondary | ICD-10-CM | POA: Diagnosis present

## 2014-09-03 DIAGNOSIS — B9562 Methicillin resistant Staphylococcus aureus infection as the cause of diseases classified elsewhere: Secondary | ICD-10-CM | POA: Diagnosis present

## 2014-09-03 LAB — COMPREHENSIVE METABOLIC PANEL
ALT: 25 U/L (ref 17–63)
ANION GAP: 10 (ref 5–15)
AST: 29 U/L (ref 15–41)
Albumin: 3.2 g/dL — ABNORMAL LOW (ref 3.5–5.0)
Alkaline Phosphatase: 125 U/L (ref 38–126)
BUN: 38 mg/dL — AB (ref 6–20)
CALCIUM: 8.8 mg/dL — AB (ref 8.9–10.3)
CHLORIDE: 100 mmol/L — AB (ref 101–111)
CO2: 24 mmol/L (ref 22–32)
Creatinine, Ser: 2.08 mg/dL — ABNORMAL HIGH (ref 0.61–1.24)
GFR calc Af Amer: 30 mL/min — ABNORMAL LOW (ref >60)
GFR calc non Af Amer: 26 mL/min — ABNORMAL LOW (ref >60)
GLUCOSE: 159 mg/dL — AB (ref 65–99)
POTASSIUM: 4.4 mmol/L (ref 3.5–5.1)
Sodium: 134 mmol/L — ABNORMAL LOW (ref 135–145)
Total Bilirubin: 0.4 mg/dL (ref 0.3–1.2)
Total Protein: 6.9 g/dL (ref 6.5–8.1)

## 2014-09-03 LAB — CBC WITH DIFFERENTIAL/PLATELET
BASOS ABS: 0 10*3/uL (ref 0–0.1)
BASOS PCT: 0 %
EOS PCT: 1 %
Eosinophils Absolute: 0.1 10*3/uL (ref 0–0.7)
HEMATOCRIT: 33.9 % — AB (ref 40.0–52.0)
Hemoglobin: 10.8 g/dL — ABNORMAL LOW (ref 13.0–18.0)
Lymphocytes Relative: 12 %
Lymphs Abs: 1.1 10*3/uL (ref 1.0–3.6)
MCH: 27.8 pg (ref 26.0–34.0)
MCHC: 31.8 g/dL — ABNORMAL LOW (ref 32.0–36.0)
MCV: 87.4 fL (ref 80.0–100.0)
MONO ABS: 0.6 10*3/uL (ref 0.2–1.0)
Monocytes Relative: 6 %
NEUTROS ABS: 7.4 10*3/uL — AB (ref 1.4–6.5)
NEUTROS PCT: 81 %
PLATELETS: 200 10*3/uL (ref 150–440)
RBC: 3.88 MIL/uL — ABNORMAL LOW (ref 4.40–5.90)
RDW: 14.5 % (ref 11.5–14.5)
WBC: 9.2 10*3/uL (ref 3.8–10.6)

## 2014-09-03 LAB — TSH: TSH: 1.446 u[IU]/mL (ref 0.350–4.500)

## 2014-09-03 LAB — SEDIMENTATION RATE: SED RATE: 67 mm/h — AB (ref 0–20)

## 2014-09-03 LAB — C-REACTIVE PROTEIN: CRP: 2 mg/dL — ABNORMAL HIGH (ref <1.0)

## 2014-09-03 LAB — VITAMIN B12: VITAMIN B 12: 173 pg/mL — AB (ref 180–914)

## 2014-09-03 LAB — MAGNESIUM: Magnesium: 2.4 mg/dL (ref 1.7–2.4)

## 2014-09-04 ENCOUNTER — Ambulatory Visit: Payer: Medicare Other

## 2014-09-04 LAB — VITAMIN D 25 HYDROXY (VIT D DEFICIENCY, FRACTURES): Vit D, 25-Hydroxy: 31.9 ng/mL (ref 30.0–100.0)

## 2014-09-07 ENCOUNTER — Ambulatory Visit: Payer: Medicare Other

## 2014-09-08 ENCOUNTER — Other Ambulatory Visit
Admission: RE | Admit: 2014-09-08 | Discharge: 2014-09-08 | Disposition: A | Discharge status: Home / Self Care | Payer: Medicare Other | Source: Ambulatory Visit | Attending: Gerontology | Admitting: Gerontology

## 2014-09-08 DIAGNOSIS — R8299 Other abnormal findings in urine: Secondary | ICD-10-CM | POA: Insufficient documentation

## 2014-09-08 LAB — URINALYSIS COMPLETE WITH MICROSCOPIC (ARMC ONLY)
BACTERIA UA: NONE SEEN
Bilirubin Urine: NEGATIVE
Glucose, UA: NEGATIVE mg/dL
Ketones, ur: NEGATIVE mg/dL
NITRITE: NEGATIVE
PROTEIN: 100 mg/dL — AB
SPECIFIC GRAVITY, URINE: 1.009 (ref 1.005–1.030)
Squamous Epithelial / LPF: NONE SEEN
pH: 6 (ref 5.0–8.0)

## 2014-09-10 LAB — URINE CULTURE: CULTURE: NO GROWTH

## 2014-09-13 DIAGNOSIS — A4902 Methicillin resistant Staphylococcus aureus infection, unspecified site: Secondary | ICD-10-CM | POA: Diagnosis present

## 2014-09-16 LAB — WOUND CULTURE: CULTURE: NORMAL

## 2014-09-24 ENCOUNTER — Encounter
Admission: RE | Admit: 2014-09-24 | Discharge: 2014-09-24 | Disposition: A | Discharge status: Home / Self Care | Payer: Medicare Other | Source: Ambulatory Visit | Attending: Internal Medicine | Admitting: Internal Medicine

## 2014-09-24 DIAGNOSIS — A4902 Methicillin resistant Staphylococcus aureus infection, unspecified site: Secondary | ICD-10-CM | POA: Insufficient documentation

## 2014-10-20 ENCOUNTER — Emergency Department: Payer: Medicare Other

## 2014-10-20 ENCOUNTER — Observation Stay
Admission: EM | Admit: 2014-10-20 | Discharge: 2014-10-24 | Disposition: A | Discharge status: Skilled Nursing Facility | Payer: Medicare Other | Attending: Specialist | Admitting: Specialist

## 2014-10-20 DIAGNOSIS — Z8614 Personal history of Methicillin resistant Staphylococcus aureus infection: Secondary | ICD-10-CM | POA: Insufficient documentation

## 2014-10-20 DIAGNOSIS — E785 Hyperlipidemia, unspecified: Secondary | ICD-10-CM | POA: Diagnosis not present

## 2014-10-20 DIAGNOSIS — Z79891 Long term (current) use of opiate analgesic: Secondary | ICD-10-CM | POA: Diagnosis not present

## 2014-10-20 DIAGNOSIS — M4312 Spondylolisthesis, cervical region: Secondary | ICD-10-CM | POA: Insufficient documentation

## 2014-10-20 DIAGNOSIS — Z951 Presence of aortocoronary bypass graft: Secondary | ICD-10-CM | POA: Diagnosis not present

## 2014-10-20 DIAGNOSIS — R29898 Other symptoms and signs involving the musculoskeletal system: Secondary | ICD-10-CM | POA: Diagnosis present

## 2014-10-20 DIAGNOSIS — I509 Heart failure, unspecified: Secondary | ICD-10-CM | POA: Diagnosis not present

## 2014-10-20 DIAGNOSIS — Z7901 Long term (current) use of anticoagulants: Secondary | ICD-10-CM | POA: Diagnosis not present

## 2014-10-20 DIAGNOSIS — N189 Chronic kidney disease, unspecified: Secondary | ICD-10-CM | POA: Diagnosis not present

## 2014-10-20 DIAGNOSIS — Z85828 Personal history of other malignant neoplasm of skin: Secondary | ICD-10-CM | POA: Insufficient documentation

## 2014-10-20 DIAGNOSIS — I252 Old myocardial infarction: Secondary | ICD-10-CM | POA: Diagnosis not present

## 2014-10-20 DIAGNOSIS — E1122 Type 2 diabetes mellitus with diabetic chronic kidney disease: Secondary | ICD-10-CM | POA: Diagnosis not present

## 2014-10-20 DIAGNOSIS — L8990 Pressure ulcer of unspecified site, unspecified stage: Secondary | ICD-10-CM | POA: Insufficient documentation

## 2014-10-20 DIAGNOSIS — Z9862 Peripheral vascular angioplasty status: Secondary | ICD-10-CM | POA: Insufficient documentation

## 2014-10-20 DIAGNOSIS — Z79899 Other long term (current) drug therapy: Secondary | ICD-10-CM | POA: Insufficient documentation

## 2014-10-20 DIAGNOSIS — Z794 Long term (current) use of insulin: Secondary | ICD-10-CM | POA: Insufficient documentation

## 2014-10-20 DIAGNOSIS — I2510 Atherosclerotic heart disease of native coronary artery without angina pectoris: Secondary | ICD-10-CM | POA: Diagnosis not present

## 2014-10-20 DIAGNOSIS — I4891 Unspecified atrial fibrillation: Secondary | ICD-10-CM | POA: Insufficient documentation

## 2014-10-20 DIAGNOSIS — R531 Weakness: Secondary | ICD-10-CM | POA: Diagnosis not present

## 2014-10-20 DIAGNOSIS — Z9849 Cataract extraction status, unspecified eye: Secondary | ICD-10-CM | POA: Insufficient documentation

## 2014-10-20 DIAGNOSIS — N390 Urinary tract infection, site not specified: Principal | ICD-10-CM | POA: Diagnosis present

## 2014-10-20 DIAGNOSIS — Z66 Do not resuscitate: Secondary | ICD-10-CM | POA: Insufficient documentation

## 2014-10-20 DIAGNOSIS — E860 Dehydration: Secondary | ICD-10-CM | POA: Insufficient documentation

## 2014-10-20 DIAGNOSIS — G8929 Other chronic pain: Secondary | ICD-10-CM | POA: Insufficient documentation

## 2014-10-20 DIAGNOSIS — Z888 Allergy status to other drugs, medicaments and biological substances status: Secondary | ICD-10-CM | POA: Insufficient documentation

## 2014-10-20 DIAGNOSIS — Z9049 Acquired absence of other specified parts of digestive tract: Secondary | ICD-10-CM | POA: Diagnosis not present

## 2014-10-20 DIAGNOSIS — Z885 Allergy status to narcotic agent status: Secondary | ICD-10-CM | POA: Diagnosis not present

## 2014-10-20 DIAGNOSIS — Z880 Allergy status to penicillin: Secondary | ICD-10-CM | POA: Insufficient documentation

## 2014-10-20 HISTORY — DX: Type 2 diabetes mellitus without complications: E11.9

## 2014-10-20 LAB — URINALYSIS COMPLETE WITH MICROSCOPIC (ARMC ONLY)
BILIRUBIN URINE: NEGATIVE
Glucose, UA: NEGATIVE mg/dL
Ketones, ur: NEGATIVE mg/dL
NITRITE: POSITIVE — AB
PROTEIN: NEGATIVE mg/dL
SPECIFIC GRAVITY, URINE: 1.003 — AB (ref 1.005–1.030)
pH: 7 (ref 5.0–8.0)

## 2014-10-20 LAB — CBC WITH DIFFERENTIAL/PLATELET
Basophils Absolute: 0 10*3/uL (ref 0–0.1)
Basophils Relative: 1 %
EOS ABS: 0.1 10*3/uL (ref 0–0.7)
EOS PCT: 1 %
HCT: 33.4 % — ABNORMAL LOW (ref 40.0–52.0)
HEMOGLOBIN: 10.8 g/dL — AB (ref 13.0–18.0)
Lymphocytes Relative: 21 %
Lymphs Abs: 1.7 10*3/uL (ref 1.0–3.6)
MCH: 28.3 pg (ref 26.0–34.0)
MCHC: 32.4 g/dL (ref 32.0–36.0)
MCV: 87.3 fL (ref 80.0–100.0)
Monocytes Absolute: 0.7 10*3/uL (ref 0.2–1.0)
Monocytes Relative: 9 %
Neutro Abs: 5.5 10*3/uL (ref 1.4–6.5)
Neutrophils Relative %: 68 %
PLATELETS: 142 10*3/uL — AB (ref 150–440)
RBC: 3.83 MIL/uL — ABNORMAL LOW (ref 4.40–5.90)
RDW: 17.4 % — ABNORMAL HIGH (ref 11.5–14.5)
WBC: 8.2 10*3/uL (ref 3.8–10.6)

## 2014-10-20 LAB — COMPREHENSIVE METABOLIC PANEL
ALK PHOS: 74 U/L (ref 38–126)
ALT: 26 U/L (ref 17–63)
AST: 27 U/L (ref 15–41)
Albumin: 3.4 g/dL — ABNORMAL LOW (ref 3.5–5.0)
Anion gap: 6 (ref 5–15)
BUN: 39 mg/dL — AB (ref 6–20)
CHLORIDE: 99 mmol/L — AB (ref 101–111)
CO2: 25 mmol/L (ref 22–32)
Calcium: 9 mg/dL (ref 8.9–10.3)
Creatinine, Ser: 1.78 mg/dL — ABNORMAL HIGH (ref 0.61–1.24)
GFR calc Af Amer: 37 mL/min — ABNORMAL LOW (ref >60)
GFR calc non Af Amer: 32 mL/min — ABNORMAL LOW (ref >60)
Glucose, Bld: 158 mg/dL — ABNORMAL HIGH (ref 65–99)
Potassium: 5 mmol/L (ref 3.5–5.1)
SODIUM: 130 mmol/L — AB (ref 135–145)
Total Bilirubin: 0.7 mg/dL (ref 0.3–1.2)
Total Protein: 6.4 g/dL — ABNORMAL LOW (ref 6.5–8.1)

## 2014-10-20 LAB — APTT: APTT: 34 s (ref 24–36)

## 2014-10-20 LAB — PROTIME-INR
INR: 1.24
PROTHROMBIN TIME: 15.8 s — AB (ref 11.4–15.0)

## 2014-10-20 LAB — TROPONIN I: Troponin I: 0.04 ng/mL — ABNORMAL HIGH (ref <0.031)

## 2014-10-20 NOTE — ED Notes (Signed)
According to EMS, pt reports that he suddenly experienced right and left lower leg weakness that required him to immediately sit down in a chair. Pt denies head trauma or loss of consciousness. Pt arrives to ED alert and oriented x 4.

## 2014-10-20 NOTE — ED Notes (Signed)
MRI tech Caryl Pina) calling in to speak to this RN following conversation with Dr. Corky Downs. Tech asking about presence of implants inside of the body. Patient poor historian - list of potentials reviewed with patient by this RN. It was determined that patient has cardiac stents in place, in addition to his penile implant. Patient unable to advise as to type of penile implant or when it was placed, however advised that it was placed at [facility] by a Dr. Gareth Eagle. This information was communicated to Caryl Pina, who will in turn follow up with [facility] to ensure patient safety with MD ordered MRI procedures.

## 2014-10-20 NOTE — ED Provider Notes (Addendum)
[facility] Emergency Department Provider Note  ____________________________________________  Time seen: On arrival  I have reviewed the triage vital signs and the nursing notes.   HISTORY  Chief Complaint Extremity Weakness    HPI [name] is a 81 y.o. male who presents with leg weakness. He reports he was doing well today and was ambulating as his physical therapist asked him to when he felt weak in the legs. He reports he was unable to move both legs for a period of time. He notes this started approximately 6:30 PM. Patient arrived at around 8:30 PM. He is now able to move his right leg but is unable to lift his left leg off the bed. This is compared by the fact that he has a history of neuropathy. His sister reports that something similar happened in the pack and it was due to dehydration. He denies headaches, no other weakness. He has no back pain. He does have a history of atrial fibrillation is on eliquis. No fevers chills      Past Medical History  Diagnosis Date  . Coronary artery disease   . Heart attack   . Diabetes mellitus without complication   . Atrial fibrillation   . Skin cancer   . Chronic kidney disease   . CHF (congestive heart failure)     Patient Active Problem List   Diagnosis Date Noted  . Chronic systolic heart failure 25/07/3974  . Diabetes 08/02/2014  . Hypotension 08/02/2014  . Bladder outlet obstruction 08/02/2014    Past Surgical History  Procedure Laterality Date  . Coronary artery bypass graft  1989  . Skin cancer excision    . Laminectomy    . Coronary angioplasty    . Cataract extraction Left     Current Outpatient Rx  Name  Route  Sig  Dispense  Refill  . acetaminophen (TYLENOL) 325 MG tablet   Oral   Take 650 mg by mouth every 4 (four) hours as needed for mild pain, fever or headache.          Marland Kitchen apixaban (ELIQUIS) 2.5 MG TABS tablet   Oral   Take 2.5 mg by mouth 2 (two) times daily.          . Cholecalciferol (VITAMIN D3) 2000 UNITS capsule   Oral   Take 2,000 Units by mouth daily.         Marland Kitchen docusate sodium (COLACE) 100 MG capsule   Oral   Take 100 mg by mouth 2 (two) times daily.         Marland Kitchen escitalopram (LEXAPRO) 10 MG tablet   Oral   Take 10 mg by mouth daily.         . folic acid (FOLVITE) 734 MCG tablet   Oral   Take 400 mcg by mouth daily.         . insulin glargine (LANTUS) 100 UNIT/ML injection   Subcutaneous   Inject 12 Units into the skin at bedtime.          Marland Kitchen loperamide (IMODIUM) 2 MG capsule   Oral   Take 2-4 mg by mouth every 3 (three) hours as needed for diarrhea or loose stools.         Marland Kitchen LORazepam (ATIVAN) 0.5 MG tablet   Oral   Take 0.5 mg by mouth every 4 (four) hours as needed for anxiety.         . metoprolol succinate (TOPROL-XL) 50 MG 24 hr tablet  Oral   Take 50 mg by mouth daily.          . Multiple Vitamins-Minerals (PRESERVISION AREDS 2 PO)   Oral   Take 1 capsule by mouth 2 (two) times daily.         . Multiple Vitamins-Minerals (THEREMS-M PO)   Oral   Take 1 tablet by mouth daily.         Marland Kitchen oxyCODONE (OXY IR/ROXICODONE) 5 MG immediate release tablet   Oral   Take 5 mg by mouth every 4 (four) hours as needed for severe pain.         . phenazopyridine (PYRIDIUM) 200 MG tablet   Oral   Take 1 tablet (200 mg total) by mouth 3 (three) times daily as needed for pain.   10 tablet   0   . senna (SENOKOT) 8.6 MG TABS tablet   Oral   Take 1 tablet by mouth 2 (two) times daily as needed for mild constipation.         . senna-docusate (SENNA S) 8.6-50 MG per tablet   Oral   Take 1 tablet by mouth 2 (two) times daily.         . silver sulfADIAZINE (SILVADENE) 1 % cream   Topical   Apply 1 application topically daily.         . simvastatin (ZOCOR) 40 MG tablet   Oral   Take 40 mg by mouth at bedtime.          . sitaGLIPtin (JANUVIA) 50 MG tablet   Oral   Take 50 mg by mouth daily.          Marland Kitchen torsemide (DEMADEX) 5 MG tablet   Oral   Take 5 mg by mouth daily.         . traMADol (ULTRAM) 50 MG tablet   Oral   Take 50 mg by mouth every 6 (six) hours as needed for moderate pain.          . traZODone (DESYREL) 100 MG tablet   Oral   Take 100 mg by mouth at bedtime.         . vitamin B-12 (CYANOCOBALAMIN) 1000 MCG tablet   Oral   Take 1,000 mcg by mouth daily.         Marland Kitchen sulfamethoxazole-trimethoprim (BACTRIM DS,SEPTRA DS) 800-160 MG per tablet   Oral   Take 1 tablet by mouth 2 (two) times daily. Patient not taking: Reported on 10/20/2014   20 tablet   0     Allergies Ambien; Morphine and related; and Penicillins  History reviewed. No pertinent family history.  Social History History  Substance Use Topics  . Smoking status: Never Smoker   . Smokeless tobacco: Never Used  . Alcohol Use: No    Review of Systems  Constitutional: Negative for fever. Eyes: Negative for visual changes. ENT: Negative for sore throat Cardiovascular: Negative for chest pain. Respiratory: Negative for shortness of breath. Gastrointestinal: Negative for abdominal pain, vomiting and diarrhea. Genitourinary: Negative for dysuria. Musculoskeletal: Negative for back pain. Skin: Negative for rash. Neurological: Negative for headaches. Psychiatric: No anxiety   ____________________________________________   PHYSICAL EXAM:  VITAL SIGNS: ED Triage Vitals  Enc Vitals Group     BP 10/20/14 1951 143/93 mmHg     Pulse Rate 10/20/14 1951 88     Resp 10/20/14 1951 15     Temp --      Temp src --      SpO2 10/20/14  1951 100 %     Weight --      Height --      Head Cir --      Peak Flow --      Pain Score --      Pain Loc --      Pain Edu? --      Excl. in [place]? --      Constitutional: Alert and oriented. Well appearing and in no distress. Pleasant and interactive. Hard of hearing Eyes: Conjunctivae are normal. PERRLA, EOMI ENT   Head: Normocephalic and  atraumatic.   Mouth/Throat: Mucous membranes are moist. Cardiovascular: Normal rate, irregularly irregular rhythm. Normal and symmetric distal pulses are present in all extremities. No murmurs, rubs, or gallops. Respiratory: Normal respiratory effort without tachypnea nor retractions. Breath sounds are clear and equal bilaterally.  Gastrointestinal: Soft and non-tender in all quadrants. No distention. There is no CVA tenderness. Genitourinary: deferred Musculoskeletal: Nontender with normal range of motion in all extremities. No lower extremity tenderness nor edema. Neurologic:  Cranial nerves II through XII intact. Normal speech and language. Patient is able to lift right leg approximately 6 inches off the bed and hold it there briefly. He is unable to lift the left leg at all. He has normal strength in his upper extremities. Skin:  Skin is warm, dry and intact. No rash noted. Psychiatric: Mood and affect are normal. Patient exhibits appropriate insight and judgment.  ____________________________________________    LABS (pertinent positives/negatives)  Labs Reviewed  COMPREHENSIVE METABOLIC PANEL - Abnormal; Notable for the following:    Sodium 130 (*)    Chloride 99 (*)    Glucose, Bld 158 (*)    BUN 39 (*)    Creatinine, Ser 1.78 (*)    Total Protein 6.4 (*)    Albumin 3.4 (*)    GFR calc non Af Amer 32 (*)    GFR calc Af Amer 37 (*)    All other components within normal limits  CBC WITH DIFFERENTIAL/PLATELET - Abnormal; Notable for the following:    RBC 3.83 (*)    Hemoglobin 10.8 (*)    HCT 33.4 (*)    RDW 17.4 (*)    Platelets 142 (*)    All other components within normal limits  TROPONIN I - Abnormal; Notable for the following:    Troponin I 0.04 (*)    All other components within normal limits  PROTIME-INR - Abnormal; Notable for the following:    Prothrombin Time 15.8 (*)    All other components within normal limits  URINALYSIS COMPLETEWITH MICROSCOPIC ([facility]  ONLY) - Abnormal; Notable for the following:    Color, Urine YELLOW (*)    APPearance HAZY (*)    Specific Gravity, Urine 1.003 (*)    Hgb urine dipstick 2+ (*)    Nitrite POSITIVE (*)    Leukocytes, UA 3+ (*)    Bacteria, UA RARE (*)    Squamous Epithelial / LPF 0-5 (*)    All other components within normal limits  APTT    ____________________________________________   EKG  ED ECG REPORT I, Lavonia Drafts, the attending physician, personally viewed and interpreted this ECG.   Date: 10/20/2014  EKG Time: 7:47 PM  Rate: 82  Rhythm: atrial fibrillation, rate 82  Axis: Normal  Intervals:none  ST&T Change: Nonspecific   ____________________________________________    RADIOLOGY I have personally reviewed any xrays that were ordered on this patient: CT head unremarkable  ____________________________________________   PROCEDURES  Procedure(s)  performed: none  Critical Care performed: none  ____________________________________________   INITIAL IMPRESSION / ASSESSMENT AND PLAN / ED COURSE  Pertinent labs & imaging results that were available during my care of the patient were reviewed by me and considered in my medical decision making (see chart for details).  Patient with relatively abrupt onset of bilateral lower extremity weakness. Which has now progressed to mainly left leg weakness. He denies headache. He has no upper extremity weakness. No other neuro deficits. This would be an unusual presentation for CVA however is certainly on the differential. He has no back pain. He does have a superpubic catheter which is chronic.   I have asked for a stat neuro consult given that he is technically within the window although he has a contract indication with being on eliquis and my sense is that this is not a CVA. His CT shows no acute findings.   Neurology recommendations received. No TPA given patient is on eliquis.  Neurology Recommend stat MRI C-spine T-spine and  L-spine. ____________________________________________  ----------------------------------------- 11:00 PM on 10/20/2014 -----------------------------------------  Discussed with radiology they say penile implant should not affect MRI. MRI techs called in  ----------------------------------------- 11:39 PM on 10/20/2014 ----------------------------------------- Apparently now there is am concerned about penile implants and an MRI. We will do CT spine at this time   FINAL CLINICAL IMPRESSION(S) / ED DIAGNOSES  Final diagnoses:  Lower extremity weakness  Lower extremity weakness     Lavonia Drafts, MD 10/20/14 4982  Lavonia Drafts, MD 10/20/14 2340

## 2014-10-21 DIAGNOSIS — N390 Urinary tract infection, site not specified: Secondary | ICD-10-CM | POA: Diagnosis present

## 2014-10-21 LAB — GLUCOSE, CAPILLARY
Glucose-Capillary: 118 mg/dL — ABNORMAL HIGH (ref 65–99)
Glucose-Capillary: 139 mg/dL — ABNORMAL HIGH (ref 65–99)
Glucose-Capillary: 134 mg/dL — ABNORMAL HIGH (ref 65–99)
Glucose-Capillary: 230 mg/dL — ABNORMAL HIGH (ref 65–99)

## 2014-10-21 LAB — TSH: TSH: 1.994 u[IU]/mL (ref 0.350–4.500)

## 2014-10-21 LAB — HEMOGLOBIN A1C: Hgb A1c MFr Bld: 7.2 % — ABNORMAL HIGH (ref 4.0–6.0)

## 2014-10-21 LAB — TROPONIN I: Troponin I: 0.03 ng/mL (ref <0.031)

## 2014-10-21 LAB — MRSA PCR SCREENING: MRSA by PCR: NEGATIVE

## 2014-10-21 MED ORDER — VANCOMYCIN HCL 10 G IV SOLR
1250.0000 mg | INTRAVENOUS | Status: DC
  Filled 2014-10-21: qty 1250

## 2014-10-21 MED ORDER — HYDRALAZINE HCL 20 MG/ML IJ SOLN
INTRAMUSCULAR | Status: AC
  Administered 2014-10-21: 10 mg via INTRAVENOUS
  Filled 2014-10-21: qty 1

## 2014-10-21 MED ORDER — APIXABAN 2.5 MG PO TABS
2.5000 mg | ORAL_TABLET | Freq: Two times a day (BID) | ORAL | Status: DC
  Administered 2014-10-21 – 2014-10-24 (×7): 2.5 mg via ORAL
  Filled 2014-10-21 (×7): qty 1

## 2014-10-21 MED ORDER — FOLIC ACID 1 MG PO TABS
0.5000 mg | ORAL_TABLET | Freq: Every day | ORAL | Status: DC
  Administered 2014-10-21 – 2014-10-24 (×4): 0.5 mg via ORAL
  Filled 2014-10-21 (×4): qty 1

## 2014-10-21 MED ORDER — SENNOSIDES-DOCUSATE SODIUM 8.6-50 MG PO TABS
1.0000 | ORAL_TABLET | Freq: Two times a day (BID) | ORAL | Status: DC
  Administered 2014-10-21 – 2014-10-24 (×6): 1 via ORAL
  Filled 2014-10-21 (×5): qty 1

## 2014-10-21 MED ORDER — ONDANSETRON HCL 4 MG/2ML IJ SOLN: 4.0000 mg | Freq: Four times a day (QID) | INTRAMUSCULAR | Status: DC | PRN

## 2014-10-21 MED ORDER — INSULIN GLARGINE 100 UNIT/ML ~~LOC~~ SOLN
6.0000 [IU] | Freq: Every day | SUBCUTANEOUS | Status: DC
  Administered 2014-10-21 – 2014-10-23 (×3): 6 [IU] via SUBCUTANEOUS
  Filled 2014-10-21 (×4): qty 0.06

## 2014-10-21 MED ORDER — FENTANYL CITRATE (PF) 100 MCG/2ML IJ SOLN
INTRAMUSCULAR | Status: AC
  Administered 2014-10-21: 25 ug via INTRAVENOUS
  Filled 2014-10-21: qty 2

## 2014-10-21 MED ORDER — SODIUM CHLORIDE 0.9 % IV SOLN
INTRAVENOUS | Status: DC
  Administered 2014-10-21 – 2014-10-23 (×5): via INTRAVENOUS

## 2014-10-21 MED ORDER — TRAZODONE HCL 50 MG PO TABS
100.0000 mg | ORAL_TABLET | Freq: Every day | ORAL | Status: DC
  Administered 2014-10-21 – 2014-10-23 (×3): 100 mg via ORAL
  Filled 2014-10-21 (×4): qty 2

## 2014-10-21 MED ORDER — VANCOMYCIN HCL 10 G IV SOLR
1250.0000 mg | Freq: Once | INTRAVENOUS | Status: AC
  Administered 2014-10-21: 1250 mg via INTRAVENOUS
  Filled 2014-10-21: qty 1250

## 2014-10-21 MED ORDER — SILVER SULFADIAZINE 1 % EX CREA
1.0000 "application " | TOPICAL_CREAM | Freq: Every day | CUTANEOUS | Status: DC
  Administered 2014-10-22 – 2014-10-24 (×3): 1 via TOPICAL
  Filled 2014-10-21 (×2): qty 85

## 2014-10-21 MED ORDER — SIMVASTATIN 40 MG PO TABS
40.0000 mg | ORAL_TABLET | Freq: Every day | ORAL | Status: DC
  Administered 2014-10-21 – 2014-10-23 (×3): 40 mg via ORAL
  Filled 2014-10-21 (×3): qty 1

## 2014-10-21 MED ORDER — SULFAMETHOXAZOLE-TRIMETHOPRIM 400-80 MG PO TABS
1.0000 | ORAL_TABLET | Freq: Two times a day (BID) | ORAL | Status: DC
  Administered 2014-10-21 – 2014-10-23 (×5): 1 via ORAL
  Filled 2014-10-21 (×8): qty 1

## 2014-10-21 MED ORDER — PHENAZOPYRIDINE HCL 200 MG PO TABS
200.0000 mg | ORAL_TABLET | Freq: Three times a day (TID) | ORAL | Status: AC
  Administered 2014-10-21 – 2014-10-23 (×7): 200 mg via ORAL
  Filled 2014-10-21 (×5): qty 1

## 2014-10-21 MED ORDER — FOLIC ACID 400 MCG PO TABS: 400.0000 ug | ORAL_TABLET | Freq: Every day | ORAL | Status: DC

## 2014-10-21 MED ORDER — VITAMIN B-12 1000 MCG PO TABS
1000.0000 ug | ORAL_TABLET | Freq: Every day | ORAL | Status: DC
  Administered 2014-10-21 – 2014-10-24 (×2): 1000 ug via ORAL
  Filled 2014-10-21 (×3): qty 1

## 2014-10-21 MED ORDER — SENNA 8.6 MG PO TABS
1.0000 | ORAL_TABLET | Freq: Two times a day (BID) | ORAL | Status: DC | PRN
Start: 2014-10-21 — End: 2014-10-24
  Administered 2014-10-22 – 2014-10-24 (×2): 8.6 mg via ORAL
  Filled 2014-10-21 (×2): qty 1

## 2014-10-21 MED ORDER — INSULIN ASPART 100 UNIT/ML ~~LOC~~ SOLN
0.0000 [IU] | Freq: Three times a day (TID) | SUBCUTANEOUS | Status: DC
  Administered 2014-10-21 (×2): 1 [IU] via SUBCUTANEOUS
  Administered 2014-10-22: 2 [IU] via SUBCUTANEOUS
  Administered 2014-10-22 – 2014-10-23 (×2): 1 [IU] via SUBCUTANEOUS
  Administered 2014-10-23: 2 [IU] via SUBCUTANEOUS
  Administered 2014-10-23 – 2014-10-24 (×2): 1 [IU] via SUBCUTANEOUS
  Filled 2014-10-21: qty 1
  Filled 2014-10-21: qty 2
  Filled 2014-10-21 (×3): qty 1
  Filled 2014-10-21: qty 2
  Filled 2014-10-21 (×2): qty 1

## 2014-10-21 MED ORDER — DOCUSATE SODIUM 100 MG PO CAPS
100.0000 mg | ORAL_CAPSULE | Freq: Two times a day (BID) | ORAL | Status: DC
  Administered 2014-10-21 – 2014-10-24 (×6): 100 mg via ORAL
  Filled 2014-10-21 (×6): qty 1

## 2014-10-21 MED ORDER — TORSEMIDE 5 MG PO TABS
5.0000 mg | ORAL_TABLET | Freq: Every day | ORAL | Status: DC
  Administered 2014-10-21 – 2014-10-24 (×4): 5 mg via ORAL
  Filled 2014-10-21 (×4): qty 1

## 2014-10-21 MED ORDER — DEXTROSE 5 % IV SOLN
1.0000 g | Freq: Once | INTRAVENOUS | Status: AC
  Administered 2014-10-21: 1 g via INTRAVENOUS
  Filled 2014-10-21: qty 10

## 2014-10-21 MED ORDER — OCUVITE-LUTEIN PO CAPS
1.0000 | ORAL_CAPSULE | Freq: Two times a day (BID) | ORAL | Status: DC
  Administered 2014-10-21 – 2014-10-24 (×6): 1 via ORAL
  Filled 2014-10-21 (×7): qty 1

## 2014-10-21 MED ORDER — TRAMADOL HCL 50 MG PO TABS: 50.0000 mg | ORAL_TABLET | Freq: Four times a day (QID) | ORAL | Status: DC | PRN

## 2014-10-21 MED ORDER — LORAZEPAM 0.5 MG PO TABS
0.5000 mg | ORAL_TABLET | ORAL | Status: DC | PRN
  Administered 2014-10-21 (×2): 0.5 mg via ORAL
  Filled 2014-10-21 (×2): qty 1

## 2014-10-21 MED ORDER — METOPROLOL SUCCINATE ER 50 MG PO TB24
50.0000 mg | ORAL_TABLET | Freq: Every day | ORAL | Status: DC
  Administered 2014-10-21 – 2014-10-24 (×4): 50 mg via ORAL
  Filled 2014-10-21 (×4): qty 1

## 2014-10-21 MED ORDER — HYDRALAZINE HCL 20 MG/ML IJ SOLN
10.0000 mg | INTRAMUSCULAR | Status: DC | PRN
  Administered 2014-10-21: 10 mg via INTRAVENOUS

## 2014-10-21 MED ORDER — ADULT MULTIVITAMIN W/MINERALS CH
1.0000 | ORAL_TABLET | Freq: Every day | ORAL | Status: DC
  Administered 2014-10-21 – 2014-10-24 (×3): 1 via ORAL
  Filled 2014-10-21 (×6): qty 1

## 2014-10-21 MED ORDER — FENTANYL CITRATE (PF) 100 MCG/2ML IJ SOLN
25.0000 ug | Freq: Once | INTRAMUSCULAR | Status: AC
  Administered 2014-10-21: 25 ug via INTRAVENOUS

## 2014-10-21 MED ORDER — ACETAMINOPHEN 325 MG PO TABS
650.0000 mg | ORAL_TABLET | ORAL | Status: DC | PRN
  Administered 2014-10-21 – 2014-10-22 (×2): 650 mg via ORAL
  Filled 2014-10-21 (×2): qty 2

## 2014-10-21 MED ORDER — ESCITALOPRAM OXALATE 10 MG PO TABS
10.0000 mg | ORAL_TABLET | Freq: Every day | ORAL | Status: DC
  Administered 2014-10-21 – 2014-10-24 (×4): 10 mg via ORAL
  Filled 2014-10-21 (×4): qty 1

## 2014-10-21 MED ORDER — VITAMIN D 1000 UNITS PO TABS
2000.0000 [IU] | ORAL_TABLET | Freq: Every day | ORAL | Status: DC
  Administered 2014-10-21 – 2014-10-24 (×3): 2000 [IU] via ORAL
  Filled 2014-10-21 (×3): qty 2

## 2014-10-21 MED ORDER — ONDANSETRON HCL 4 MG PO TABS: 4.0000 mg | ORAL_TABLET | Freq: Four times a day (QID) | ORAL | Status: DC | PRN

## 2014-10-21 MED ORDER — OXYCODONE HCL 5 MG PO TABS
5.0000 mg | ORAL_TABLET | ORAL | Status: DC | PRN
  Administered 2014-10-21 – 2014-10-23 (×2): 5 mg via ORAL
  Filled 2014-10-21 (×2): qty 1

## 2014-10-21 MED ORDER — LINAGLIPTIN 5 MG PO TABS
5.0000 mg | ORAL_TABLET | Freq: Every day | ORAL | Status: DC
  Administered 2014-10-21 – 2014-10-24 (×4): 5 mg via ORAL
  Filled 2014-10-21 (×4): qty 1

## 2014-10-21 MED ORDER — LOPERAMIDE HCL 2 MG PO CAPS: 2.0000 mg | ORAL_CAPSULE | ORAL | Status: DC | PRN

## 2014-10-21 NOTE — ED Notes (Addendum)
Patient returned to the ED from CT scan. Awaiting CT results. Patient now c/o LUE pain that is "real bad". RN to make MD aware.

## 2014-10-21 NOTE — Progress Notes (Signed)
[facility] NAME: [name]    MR#:  [id number]  DATE OF BIRTH:  26-Jul-1920  SUBJECTIVE:  CHIEF COMPLAINT:   Chief Complaint  Patient presents with  . Extremity Weakness    Right and Left Legs    REVIEW OF SYSTEMS:  CONSTITUTIONAL: No fever, fatigue or weakness.  EYES: No blurred or double vision.  EARS, NOSE, AND THROAT: No tinnitus or ear pain. Has hearing deficit. RESPIRATORY: No cough, shortness of breath, wheezing or hemoptysis.  CARDIOVASCULAR: No chest pain, orthopnea, edema.  GASTROINTESTINAL: No nausea, vomiting, diarrhea or abdominal pain.  GENITOURINARY: No dysuria, hematuria.  ENDOCRINE: No polyuria, nocturia,  HEMATOLOGY: No anemia, easy bruising or bleeding SKIN: No rash or lesion. MUSCULOSKELETAL: No joint pain or arthritis.   NEUROLOGIC: No tingling, numbness, have c/o left lower limb weakness.  PSYCHIATRY: No anxiety or depression.   ROS  DRUG ALLERGIES:   Allergies  Allergen Reactions  . Ambien [Zolpidem Tartrate] Other (See Comments)    Reaction:  Altered mental status   . Morphine And Related Other (See Comments)    Reaction:  Altered mental status   . Penicillins Other (See Comments)    Reaction:  Unknown     VITALS:  Blood pressure 125/73, pulse 102, temperature 97.8 F (36.6 C), temperature source Oral, resp. rate 18, height 6' (1.829 m), weight 80.967 kg (178 lb 8 oz), SpO2 100 %.  PHYSICAL EXAMINATION:  GENERAL:  81 y.o.-year-old patient lying in the bed with no acute distress.  EYES: Pupils equal, round, reactive to light and accommodation. No scleral icterus. Extraocular muscles intact.  HEENT: Head atraumatic, normocephalic. Oropharynx and nasopharynx clear.  NECK:  Supple, no jugular venous distention. No thyroid enlargement, no tenderness.  LUNGS: Normal breath sounds bilaterally, no wheezing, rales,rhonchi or crepitation. No use of accessory muscles of respiration.   CARDIOVASCULAR: S1, S2 normal. No murmurs, rubs, or gallops.  ABDOMEN: Soft, nontender, nondistended. Bowel sounds present. No organomegaly or mass. Supra pubic catheter. EXTREMITIES: No pedal edema, cyanosis, or clubbing.  NEUROLOGIC: Cranial nerves II through XII are intact. Muscle strength 4/5 in all extremities except left lower- 3/5. Sensation intact. Gait not checked.  PSYCHIATRIC: The patient is alert and oriented x 3.  SKIN: No obvious rash, lesion, or ulcer.   Physical Exam LABORATORY PANEL:   CBC  Recent Labs Lab 10/20/14 1957  WBC 8.2  HGB 10.8*  HCT 33.4*  PLT 142*   ------------------------------------------------------------------------------------------------------------------  Chemistries   Recent Labs Lab 10/20/14 1957  NA 130*  K 5.0  CL 99*  CO2 25  GLUCOSE 158*  BUN 39*  CREATININE 1.78*  CALCIUM 9.0  AST 27  ALT 26  ALKPHOS 74  BILITOT 0.7   ------------------------------------------------------------------------------------------------------------------  Cardiac Enzymes  Recent Labs Lab 10/20/14 1957 10/21/14 0304  TROPONINI 0.04* 0.03   ------------------------------------------------------------------------------------------------------------------  RADIOLOGY:  Ct Head Wo Contrast  10/20/2014   CLINICAL DATA:  Sudden onset of bilateral lower extremity weakness.  EXAM: CT HEAD WITHOUT CONTRAST  TECHNIQUE: Contiguous axial images were obtained from the base of the skull through the vertex without intravenous contrast.  COMPARISON:  None  FINDINGS: There is no intracranial hemorrhage, mass or evidence of acute infarction. There is no extra-axial fluid collection. There is encephalomalacia due to remote right parietal infarction there is white matter hypodensity consistent with chronic microvascular disease, and there is moderate generalized atrophy. There is no bony abnormality. The visible paranasal sinuses are clear.  IMPRESSION: No acute  findings. Remote right parietal infarction. Generalized atrophy and chronic microvascular ischemic disease.   Electronically Signed   By: Andreas Newport M.D.   On: 10/20/2014 20:49   Ct Cervical Spine Wo Contrast  10/21/2014   CLINICAL DATA:  81 year old male with bilateral lower extremity weakness.  EXAM: CT CERVICAL SPINE WITHOUT CONTRAST  TECHNIQUE: Multidetector CT imaging of the cervical spine was performed without intravenous contrast. Multiplanar CT image reconstructions were also generated.  COMPARISON:  None.  FINDINGS: Grade 1 anterolisthesis of C6 on C7 appears degenerative. The alignment is otherwise maintained. There is diffuse disc space narrowing throughout with endplate spurring. Peripherally calcified disc central/eccentric to the left at C4-C5 causing mild narrowing of the spinal canal. No critical stenosis. Diffuse multilevel facet arthropathy. Multilevel neural foraminal stenosis throughout. The bones are under mineralized. Vertebral body heights are preserved. There is no fracture. The dens is intact. There are no jumped or perched facets. No prevertebral soft tissue edema. Atherosclerosis noted of the carotid arteries.  IMPRESSION: Multilevel degenerative disc disease and facet arthropathy throughout the cervical spine with multifocal neural foraminal narrowing and mild narrowing the spinal canal and C4-C5. No critical stenosis. No acute bony abnormality.   Electronically Signed   By: Jeb Levering M.D.   On: 10/21/2014 00:57   Ct Thoracic Spine Wo Contrast  10/21/2014   CLINICAL DATA:  Leg weakness, left worse than right.  EXAM: CT THORACIC SPINE WITHOUT CONTRAST  TECHNIQUE: Multidetector CT imaging of the thoracic spine was performed without intravenous contrast administration. Multiplanar CT image reconstructions were also generated.  COMPARISON:  None.  FINDINGS: The thoracic vertebral column is intact. There is no evidence of acute fracture. There is no bone lesion or bony  destruction. Posterior elements are unremarkable. There are mild degenerative disc changes. The central canal is patent throughout the thoracic spine. No paraspinal abnormalities are evident.  IMPRESSION: No bone lesion, bony destruction or fracture. No significant encroachment on the central canal by bone or soft tissue. No acute findings are evident.   Electronically Signed   By: Andreas Newport M.D.   On: 10/21/2014 01:00   Ct Lumbar Spine Wo Contrast  10/21/2014   CLINICAL DATA:  81 year old male bilateral lower extremity weakness.  EXAM: CT LUMBAR SPINE WITHOUT CONTRAST  TECHNIQUE: Multidetector CT imaging of the lumbar spine was performed without intravenous contrast administration. Multiplanar CT image reconstructions were also generated.  COMPARISON:  None.  FINDINGS: Mild broad-based leftward curvature of the lumbar spine. No listhesis. Vertebral body heights are maintained. Diffuse disc space narrowing with endplate spurring and vacuum phenomenon throughout. No compression deformity or acute fracture. There is dense atherosclerosis of the included vasculature.  T12-L1: Facet arthropathy with mild neural foraminal stenosis. No stenosis central canal.  L1-L2: Posterior disc osteophyte complex and facet arthropathy, combination of which causes narrowing of the spinal canal and both neural foramina.  L2-L3: Posterior disc osteophyte complex and facet arthropathy. Combination of which causes stenosis of spinal canal and both neural foramen.  L3-L4: Posterior disc osteophyte complex with probable central disc protrusion. Facet arthropathy. Combination of which causes significant narrowing of the spinal canal and both neural foramen.  L4-L5: Posterior disc osteophyte complex and facet arthropathy. Probable right paracentral disc protrusion. Question prior laminectomy defects. Combination of which causes narrowing of the spinal canal and both neural foramen.  L5-S1: Posterior disc osteophyte complex and facet  arthropathy. Combination of this causes narrowing of the spinal canal bilateral neural foramen.  IMPRESSION:  Diffuse degenerative disc disease and facet arthropathy, combination of which causes multi focal narrowing of the central canal and neural foramen at near every level. No acute osseous abnormalities are seen.   Electronically Signed   By: Jeb Levering M.D.   On: 10/21/2014 01:09    ASSESSMENT AND PLAN:    1. Urinary tract infection: As indwelling suprapubic catheter that was replaced last month. Per report, he developed a MRSA infection at the catheter site that has been treated.  urinary tract infection which is likely causing his current weakness.   Bactrim which we will to complete a 10 day course. 2. Lower extremity weakness: No spinal stenosis. The patient does not endorse any saddle numbness. Weakness likely secondary to mild dehydration as well as  urinary tract infection.  physical therapy work with the patient while hospitalized. 3. Coronary artery disease: Stable. Continue metoprolol 4. Diabetes mellitus type 2:   reduced the patient's basal insulin dose and place him on a sliding scale insulin while hospitalized. He may continue to receive Tradjenta. 5. Atrial fibrillation: Continue Eliquis 6. DVT prophylaxis: As above 7. GI prophylaxis: None  All the records are reviewed and case discussed with Care Management/Social Workerr. Management plans discussed with the patient, family and they are in agreement.  CODE STATUS: DNR  TOTAL TIME TAKING CARE OF THIS PATIENT: 40 minutes.     POSSIBLE D/C IN 1-2 DAYS, DEPENDING ON CLINICAL CONDITION.   Vaughan Basta M.D on 10/21/2014   Between 7am to 6pm - Pager - [phone]  After 6pm go to [email or web address] - password [email or web address] [facility]  Office  [phone]  CC: Primary care physician; [name]., MD

## 2014-10-21 NOTE — Evaluation (Signed)
Physical Therapy Evaluation Patient Details Name: [name] MRN: [id number] DOB: Jan 06, 1921 Today's Date: 10/21/2014   History of Present Illness  presented to ER secondary to acute onset of LE weakness (near fall onto chair surface); admitted with UTI.  Currently pending MRI to rule out neurological pathology (CVA); x-ray/CT imaging of head, cervical, thoracic and lumbar spine negative for acute change (was significant for chronic DDD and facet arthropathy).  Clinical Impression  Upon evaluation, patient alert and oriented to basic information; visibly anxious with mobility attempts.  R LE strength grossly at least 4/5; L LE remains generally weak with proximal strength 2+ to 3-/5 and distal strength 1/5.  Functional strength appears grossly 3-/5 (patient very resistant to therapist movement at times).  Reports chronic neuropathy mid-calf distally in bilat LEs, unchanged with this event.  Currently requiring mod/max assist for bed mobility; min/mod assist for unsupported sitting balance.  Mild weakness of L lateral trunk noted with progressive L lateral lean noted (with fatigue), min assist to correct.  Patient very anxious with mobility attempts; unable/unsafe to attempt standing OOB at this time. Would benefit from skilled PT to address above deficits and promote optimal return to PLOF; recommend transition to STR upon discharge from acute hospitalization.     Follow Up Recommendations SNF    Equipment Recommendations  Rolling walker with 5" wheels    Recommendations for Other Services       Precautions / Restrictions Precautions Precautions: Fall Restrictions Weight Bearing Restrictions: No      Mobility  Bed Mobility Overal bed mobility: Needs Assistance Bed Mobility: Supine to Sit;Sit to Supine     Supine to sit: Mod assist;Max assist Sit to supine: Max assist      Transfers                 General transfer comment: unsafe/unable to complete this  date  Ambulation/Gait             General Gait Details: unsafe/unable to complete this date  Stairs            Wheelchair Mobility    Modified Rankin (Stroke Patients Only)       Balance Overall balance assessment: Needs assistance Sitting-balance support: No upper extremity supported;Feet supported Sitting balance-Leahy Scale: Fair Sitting balance - Comments: requires bilat UE support, mild/mod L lateral lean with mild elongation of L lateral trunk musculature                                     Pertinent Vitals/Pain Pain Assessment: Faces Faces Pain Scale: Hurts little more Pain Location: R UE, L knee Pain Descriptors / Indicators: Aching;Sore Pain Intervention(s): Limited activity within patient's tolerance;Monitored during session;Repositioned    Home Living Family/patient expects to be discharged to:: Assisted living               Home Equipment: Walker - 4 wheels      Prior Function Level of Independence: Independent with assistive device(s)         Comments: Indep with [id number] for household mobility/activities; ambulates to/from dining hall (approx 100' from room) for 2 meals/day.  Assist from staff for ADLs/bathing.  Chronic suprapubic catheter which staff assists with management.  Denies fall history.     Hand Dominance        Extremity/Trunk Assessment   Upper Extremity Assessment: Overall WFL for tasks assessed  Lower Extremity Assessment:  (R LE grossly at leasat 4-/5; L LE generally 2+/5 at hip, 2+ to 3-/5 at knee, 1/5 at ankle.  Chronic sensory deficit mid-calf distally bilat LEs (due to neuropathy) with minimal sensation in bilat feet (unchanged with this episode).)         Communication   Communication: HOH  Cognition Arousal/Alertness: Awake/alert Behavior During Therapy: WFL for tasks assessed/performed;Anxious Overall Cognitive Status: Within Functional Limits for tasks assessed                       General Comments General comments (skin integrity, edema, etc.): R heel ulcer (chronic)--wears pressure-relieving boot to R LE when in bed    Exercises Other Exercises Other Exercises: Unsupported sitting, min/mod assist for sitting balance.  Very anxious without UE support; mid/mod assist to correct L lateral lean. Other Exercises: Lateral scooting edge of bed, min/mod assist towards L.  Assist from therapist for L LE placement (mild/mod extensor tone with exertion); limited active WBing L LE with mobility attempts. (10 min)      Assessment/Plan    PT Assessment Patient needs continued PT services  PT Diagnosis Difficulty walking;Generalized weakness   PT Problem List Decreased strength;Decreased range of motion;Decreased activity tolerance;Decreased balance;Decreased mobility;Decreased knowledge of use of DME;Decreased safety awareness;Decreased knowledge of precautions;Cardiopulmonary status limiting activity;Decreased skin integrity;Pain  PT Treatment Interventions DME instruction;Gait training;Stair training;Functional mobility training;Therapeutic activities;Therapeutic exercise;Neuromuscular re-education;Patient/family education   PT Goals (Current goals can be found in the Care Plan section) Acute Rehab PT Goals Patient Stated Goal: "to try a little" PT Goal Formulation: With patient Time For Goal Achievement: 11/04/14 Potential to Achieve Goals: Fair Additional Goals Additional Goal #1:  (Assess and establish goals for gait as appropriate)    Frequency Min 2X/week   Barriers to discharge Decreased caregiver support      Co-evaluation               End of Session Equipment Utilized During Treatment: Gait belt Activity Tolerance: Patient tolerated treatment well Patient left: in bed;with call bell/phone within reach;with bed alarm set;with family/visitor present           Time: 3568-6168 PT Time Calculation (min) (ACUTE ONLY): 32  min   Charges:   PT Evaluation $Initial PT Evaluation Tier I: 1 Procedure PT Treatments $Therapeutic Activity: 8-22 mins   PT G Codes:        Geral Coker H. Owens Shark, PT, DPT, NCS 10/21/2014, 2:13 PM [phone]

## 2014-10-21 NOTE — ED Notes (Signed)
Recliner taken to room for visitor due to extended wait times.

## 2014-10-21 NOTE — Care Management (Signed)
Patient presents from [place] assisted living level of care per son that is present.  It is being discussed that when patient discharges, he transition to the [facility] unit at [facility] for some skilled physical therapy.  Patient has a chronic supra pubic cath that is rep0laced on a monthly basis

## 2014-10-21 NOTE — ED Notes (Signed)
Troponin order released and set for testing per MD order.

## 2014-10-21 NOTE — Care Management Utilization Note (Signed)
Patient admitted under observation.  Presented and explained observation notice.  Patient's son signed.  Original placed in medical record and provided patient/family copy 

## 2014-10-21 NOTE — ED Notes (Signed)
Spoke with Dr. Corky Downs regarding status of MRIs - advised that following conversation with Dr. Alroy Dust, patient is on hold until further information can be made about his penile implant. Patient to have CT scans in the interim. Patient and visitor updated on plan of care.

## 2014-10-21 NOTE — ED Notes (Signed)
Family member provided with meal tray, water, and warm blankets for comfort due to extended wait times.

## 2014-10-21 NOTE — Clinical Social Work Note (Signed)
Clinical Social Work Assessment  Patient Details  Name: [name] MRN: [id number] Date of Birth: 11-13-1920  Date of referral:  10/21/14               Reason for consult:  Facility Placement, Discharge Planning                Permission sought to share information with:  Family Supports, Customer service manager Permission granted to share information::  Yes, Verbal Permission Granted  Name::     Lorre Nick  Agency::  [facility]  Relationship::  son/POA  Contact Information:  [phone]  Housing/Transportation Living arrangements for the past 2 months:  [place], [place] (Pt recently moved to the SNF from the ALF at the [facility]) Source of Information:  Patient, Adult Children Patient Interpreter Needed:  None Criminal Activity/Legal Involvement Pertinent to Current Situation/Hospitalization:  No - Comment as needed Significant Relationships:  Adult Children, Siblings Lives with:  Facility Resident Do you feel safe going back to the place where you live?  Yes Need for family participation in patient care:  Yes (Comment)  Care giving concerns: Pt has been experiencing weakness in his legs.   Social Worker assessment / plan:  CSW met with pt and his son.  CSW introduced self and explained the role of CSW.  Pt is a 81 y/o widowed male (5 years) who has been residing at the [facility] for a few months and was in a previous ALF beginning in January 2016.  Prior to the move pt lived in a private residence alone.  Pt retired from [employer] approximately 1 year ago.  Pt stated he worked for [employer] for 70 plus years and was the president for 16 yrs.  Pt plans to return to [facility].  CSW spoke to admissions coordinator at [facility] who explained that pt recently transferred to the skilled nursing facility within the retirement community.  Pt is able to return to the facility.    Employment  status:  Retired Health visitor, Managed Care PT Recommendations:  [place] / Referral to community resources:  [place]  Patient/Family's Response to care:  Pt and son were pleasant and appreciative of CSW assistance.    Patient/Family's Understanding of and Emotional Response to Diagnosis, Current Treatment, and Prognosis:  Pt is agreeable to returning to the facility and was excited to share that today is his birthday.  CSW wished pt a Happy Birthday.  Emotional Assessment Appearance:  Appears stated age Attitude/Demeanor/Rapport:  Other (Cooperative) Affect (typically observed):  Accepting, Appropriate, Pleasant Orientation:  Oriented to Self, Oriented to Place, Oriented to  Time, Oriented to Situation Alcohol / Substance use:  Not Applicable Psych involvement (Current and /or in the community):  No (Comment)  Discharge Needs  Concerns to be addressed:  Discharge Planning Concerns Readmission within the last 30 days:  No Current discharge risk:  None Barriers to Discharge:  Continued Medical Work up   [place], [place], LCSW 10/21/2014, 11:26 AM

## 2014-10-21 NOTE — Progress Notes (Signed)
ANTIBIOTIC CONSULT NOTE - INITIAL  Pharmacy Consult for vancomycin dosing Indication: UTI  Allergies  Allergen Reactions  . Ambien [Zolpidem Tartrate] Other (See Comments)    Reaction:  Altered mental status   . Morphine And Related Other (See Comments)    Reaction:  Altered mental status   . Penicillins Other (See Comments)    Reaction:  Unknown     Patient Measurements: Height: 6' (182.9 cm) Weight: 178 lb 8 oz (80.967 kg) IBW/kg (Calculated) : 77.6 Adjusted Body Weight: 81 kg  Vital Signs: Temp: 97.8 F (36.6 C) (07/28 0400) Temp Source: Oral (07/28 0345) BP: 128/66 mmHg (07/28 0345) Pulse Rate: 70 (07/28 0345) Intake/Output from previous day:   Intake/Output from this shift:    Labs:  Recent Labs  10/20/14 1957  WBC 8.2  HGB 10.8*  PLT 142*  CREATININE 1.78*   Estimated Creatinine Clearance: 29.1 mL/min (by C-G formula based on Cr of 1.78). No results for input(s): VANCOTROUGH, VANCOPEAK, VANCORANDOM, GENTTROUGH, GENTPEAK, GENTRANDOM, TOBRATROUGH, TOBRAPEAK, TOBRARND, AMIKACINPEAK, AMIKACINTROU, AMIKACIN in the last 72 hours.   Microbiology: Recent Results (from the past 720 hour(s))  MRSA PCR Screening     Status: None   Collection Time: 10/21/14  5:00 AM  Result Value Ref Range Status   MRSA by PCR NEGATIVE NEGATIVE Final    Comment:        The GeneXpert MRSA Assay (FDA approved for NASAL specimens only), is one component of a comprehensive MRSA colonization surveillance program. It is not intended to diagnose MRSA infection nor to guide or monitor treatment for MRSA infections.     Medical History: Past Medical History  Diagnosis Date  . Coronary artery disease   . Heart attack   . Diabetes mellitus type 2, uncomplicated   . Atrial fibrillation   . Skin cancer   . Chronic kidney disease   . CHF (congestive heart failure)     Medications:   Assessment: Urine cx pending  Goal of Therapy:  Vancomycin trough level 10-15  mcg/ml  Plan:  TBW 81kg  IBW 77.6kg Vd 57L kei 0,028 hr- 1 t1/2 25 hours 1250 mg q 36 hours ordered with stacked dosing. Level ordered before 4th dose.  Kalid Ghan S 10/21/2014,7:03 AM

## 2014-10-21 NOTE — H&P (Signed)
[name] is an 81 y.o. male.   Chief Complaint: Weakness HPI: The patient presents emergency department complaining of weakness in his lower extremities. The patient states that he was in his usual state of health when "he lost the ability to control his legs". He states that he became very weak in his legs and fell into a chair. He denies loss of consciousness, chest pain, or shortness of breath. He has not been sick recently. He denies nausea, vomiting and diarrhea. CT scan of his spine revealed no acute abnormalities. However, laboratory evaluation in the emergency department showed urinary tract infection which prompted the emergency department to call for admission.  Past Medical History  Diagnosis Date  . Coronary artery disease   . Heart attack   . Diabetes mellitus type 2, uncomplicated   . Atrial fibrillation   . Skin cancer   . Chronic kidney disease   . CHF (congestive heart failure)     Past Surgical History  Procedure Laterality Date  . Coronary artery bypass graft  1989  . Skin cancer excision    . Laminectomy    . Coronary angioplasty    . Cataract extraction Left   . Suprapubic catheter placement    . Appendectomy      Family History  Problem Relation Age of Onset  . Diabetes Mellitus II Mother   . Diabetes Mellitus II Brother   . CAD Brother    Social History:  reports that he has never smoked. He has never used smokeless tobacco. He reports that he does not drink alcohol or use illicit drugs.  Allergies:  Allergies  Allergen Reactions  . Ambien [Zolpidem Tartrate] Other (See Comments)    Reaction:  Altered mental status   . Morphine And Related Other (See Comments)    Reaction:  Altered mental status   . Penicillins Other (See Comments)    Reaction:  Unknown     Medications Prior to Admission  Medication Sig Dispense Refill  . acetaminophen (TYLENOL) 325 MG tablet Take 650 mg by mouth every 4 (four) hours as needed for mild pain, fever or  headache.     Marland Kitchen apixaban (ELIQUIS) 2.5 MG TABS tablet Take 2.5 mg by mouth 2 (two) times daily.    . Cholecalciferol (VITAMIN D3) 2000 UNITS capsule Take 2,000 Units by mouth daily.    Marland Kitchen docusate sodium (COLACE) 100 MG capsule Take 100 mg by mouth 2 (two) times daily.    Marland Kitchen escitalopram (LEXAPRO) 10 MG tablet Take 10 mg by mouth daily.    . folic acid (FOLVITE) 166 MCG tablet Take 400 mcg by mouth daily.    . insulin glargine (LANTUS) 100 UNIT/ML injection Inject 12 Units into the skin at bedtime.     Marland Kitchen loperamide (IMODIUM) 2 MG capsule Take 2-4 mg by mouth every 3 (three) hours as needed for diarrhea or loose stools.    Marland Kitchen LORazepam (ATIVAN) 0.5 MG tablet Take 0.5 mg by mouth every 4 (four) hours as needed for anxiety.    . metoprolol succinate (TOPROL-XL) 50 MG 24 hr tablet Take 50 mg by mouth daily.     . Multiple Vitamins-Minerals (PRESERVISION AREDS 2 PO) Take 1 capsule by mouth 2 (two) times daily.    . Multiple Vitamins-Minerals (THEREMS-M PO) Take 1 tablet by mouth daily.    Marland Kitchen oxyCODONE (OXY IR/ROXICODONE) 5 MG immediate release tablet Take 5 mg by mouth every 4 (four) hours as needed for severe pain.    Marland Kitchen  phenazopyridine (PYRIDIUM) 200 MG tablet Take 1 tablet (200 mg total) by mouth 3 (three) times daily as needed for pain. 10 tablet 0  . senna (SENOKOT) 8.6 MG TABS tablet Take 1 tablet by mouth 2 (two) times daily as needed for mild constipation.    . senna-docusate (SENNA S) 8.6-50 MG per tablet Take 1 tablet by mouth 2 (two) times daily.    . silver sulfADIAZINE (SILVADENE) 1 % cream Apply 1 application topically daily.    . simvastatin (ZOCOR) 40 MG tablet Take 40 mg by mouth at bedtime.     . sitaGLIPtin (JANUVIA) 50 MG tablet Take 50 mg by mouth daily.    Marland Kitchen torsemide (DEMADEX) 5 MG tablet Take 5 mg by mouth daily.    . traMADol (ULTRAM) 50 MG tablet Take 50 mg by mouth every 6 (six) hours as needed for moderate pain.     . traZODone (DESYREL) 100 MG tablet Take 100 mg by mouth at  bedtime.    . vitamin B-12 (CYANOCOBALAMIN) 1000 MCG tablet Take 1,000 mcg by mouth daily.    Marland Kitchen sulfamethoxazole-trimethoprim (BACTRIM DS,SEPTRA DS) 800-160 MG per tablet Take 1 tablet by mouth 2 (two) times daily. (Patient not taking: Reported on 10/20/2014) 20 tablet 0    Results for orders placed or performed during the hospital encounter of 10/20/14 (from the past 48 hour(s))  Urinalysis complete, with microscopic ([facility] only)     Status: Abnormal   Collection Time: 10/20/14  7:45 PM  Result Value Ref Range   Color, Urine YELLOW (A) YELLOW   APPearance HAZY (A) CLEAR   Glucose, UA NEGATIVE NEGATIVE mg/dL   Bilirubin Urine NEGATIVE NEGATIVE   Ketones, ur NEGATIVE NEGATIVE mg/dL   Specific Gravity, Urine 1.003 (L) 1.005 - 1.030   Hgb urine dipstick 2+ (A) NEGATIVE   pH 7.0 5.0 - 8.0   Protein, ur NEGATIVE NEGATIVE mg/dL   Nitrite POSITIVE (A) NEGATIVE   Leukocytes, UA 3+ (A) NEGATIVE   RBC / HPF 0-5 0 - 5 RBC/hpf   WBC, UA TOO NUMEROUS TO COUNT 0 - 5 WBC/hpf   Bacteria, UA RARE (A) NONE SEEN   Squamous Epithelial / LPF 0-5 (A) NONE SEEN   Budding Yeast PRESENT   APTT     Status: None   Collection Time: 10/20/14  7:57 PM  Result Value Ref Range   aPTT 34 24 - 36 seconds  Comprehensive metabolic panel     Status: Abnormal   Collection Time: 10/20/14  7:57 PM  Result Value Ref Range   Sodium 130 (L) 135 - 145 mmol/L   Potassium 5.0 3.5 - 5.1 mmol/L   Chloride 99 (L) 101 - 111 mmol/L   CO2 25 22 - 32 mmol/L   Glucose, Bld 158 (H) 65 - 99 mg/dL   BUN 39 (H) 6 - 20 mg/dL   Creatinine, Ser 1.78 (H) 0.61 - 1.24 mg/dL   Calcium 9.0 8.9 - 10.3 mg/dL   Total Protein 6.4 (L) 6.5 - 8.1 g/dL   Albumin 3.4 (L) 3.5 - 5.0 g/dL   AST 27 15 - 41 U/L   ALT 26 17 - 63 U/L   Alkaline Phosphatase 74 38 - 126 U/L   Total Bilirubin 0.7 0.3 - 1.2 mg/dL   GFR calc non Af Amer 32 (L) >60 mL/min   GFR calc Af Amer 37 (L) >60 mL/min    Comment: (NOTE) The eGFR has been calculated using the CKD EPI  equation. This calculation  has not been validated in all clinical situations. eGFR's persistently <60 mL/min signify possible Chronic Kidney Disease.    Anion gap 6 5 - 15  CBC WITH DIFFERENTIAL     Status: Abnormal   Collection Time: 10/20/14  7:57 PM  Result Value Ref Range   WBC 8.2 3.8 - 10.6 K/uL   RBC 3.83 (L) 4.40 - 5.90 MIL/uL   Hemoglobin 10.8 (L) 13.0 - 18.0 g/dL   HCT 33.4 (L) 40.0 - 52.0 %   MCV 87.3 80.0 - 100.0 fL   MCH 28.3 26.0 - 34.0 pg   MCHC 32.4 32.0 - 36.0 g/dL   RDW 17.4 (H) 11.5 - 14.5 %   Platelets 142 (L) 150 - 440 K/uL   Neutrophils Relative % 68 %   Neutro Abs 5.5 1.4 - 6.5 K/uL   Lymphocytes Relative 21 %   Lymphs Abs 1.7 1.0 - 3.6 K/uL   Monocytes Relative 9 %   Monocytes Absolute 0.7 0.2 - 1.0 K/uL   Eosinophils Relative 1 %   Eosinophils Absolute 0.1 0 - 0.7 K/uL   Basophils Relative 1 %   Basophils Absolute 0.0 0 - 0.1 K/uL  Troponin I     Status: Abnormal   Collection Time: 10/20/14  7:57 PM  Result Value Ref Range   Troponin I 0.04 (H) <0.031 ng/mL    Comment: READ BACK AND VERIFIED WITH  [facility] THOMAS AT 2154 10/20/14 SDR        PERSISTENTLY INCREASED TROPONIN VALUES IN THE RANGE OF 0.04-0.49 ng/mL CAN BE SEEN IN:       -UNSTABLE ANGINA       -CONGESTIVE HEART FAILURE       -MYOCARDITIS       -CHEST TRAUMA       -ARRYHTHMIAS       -LATE PRESENTING MYOCARDIAL INFARCTION       -COPD   CLINICAL FOLLOW-UP RECOMMENDED.   Protime-INR     Status: Abnormal   Collection Time: 10/20/14  7:57 PM  Result Value Ref Range   Prothrombin Time 15.8 (H) 11.4 - 15.0 seconds   INR 1.24   Troponin I     Status: None   Collection Time: 10/21/14  3:04 AM  Result Value Ref Range   Troponin I 0.03 <0.031 ng/mL    Comment:        NO INDICATION OF MYOCARDIAL INJURY.    Ct Head Wo Contrast  10/20/2014   CLINICAL DATA:  Sudden onset of bilateral lower extremity weakness.  EXAM: CT HEAD WITHOUT CONTRAST  TECHNIQUE: Contiguous axial images were  obtained from the base of the skull through the vertex without intravenous contrast.  COMPARISON:  None  FINDINGS: There is no intracranial hemorrhage, mass or evidence of acute infarction. There is no extra-axial fluid collection. There is encephalomalacia due to remote right parietal infarction there is white matter hypodensity consistent with chronic microvascular disease, and there is moderate generalized atrophy. There is no bony abnormality. The visible paranasal sinuses are clear.  IMPRESSION: No acute findings. Remote right parietal infarction. Generalized atrophy and chronic microvascular ischemic disease.   Electronically Signed   By: Andreas Newport M.D.   On: 10/20/2014 20:49   Ct Cervical Spine Wo Contrast  10/21/2014   CLINICAL DATA:  81 year old male with bilateral lower extremity weakness.  EXAM: CT CERVICAL SPINE WITHOUT CONTRAST  TECHNIQUE: Multidetector CT imaging of the cervical spine was performed without intravenous contrast. Multiplanar CT image reconstructions were also generated.  COMPARISON:  None.  FINDINGS: Grade 1 anterolisthesis of C6 on C7 appears degenerative. The alignment is otherwise maintained. There is diffuse disc space narrowing throughout with endplate spurring. Peripherally calcified disc central/eccentric to the left at C4-C5 causing mild narrowing of the spinal canal. No critical stenosis. Diffuse multilevel facet arthropathy. Multilevel neural foraminal stenosis throughout. The bones are under mineralized. Vertebral body heights are preserved. There is no fracture. The dens is intact. There are no jumped or perched facets. No prevertebral soft tissue edema. Atherosclerosis noted of the carotid arteries.  IMPRESSION: Multilevel degenerative disc disease and facet arthropathy throughout the cervical spine with multifocal neural foraminal narrowing and mild narrowing the spinal canal and C4-C5. No critical stenosis. No acute bony abnormality.   Electronically Signed    By: Jeb Levering M.D.   On: 10/21/2014 00:57   Ct Thoracic Spine Wo Contrast  10/21/2014   CLINICAL DATA:  Leg weakness, left worse than right.  EXAM: CT THORACIC SPINE WITHOUT CONTRAST  TECHNIQUE: Multidetector CT imaging of the thoracic spine was performed without intravenous contrast administration. Multiplanar CT image reconstructions were also generated.  COMPARISON:  None.  FINDINGS: The thoracic vertebral column is intact. There is no evidence of acute fracture. There is no bone lesion or bony destruction. Posterior elements are unremarkable. There are mild degenerative disc changes. The central canal is patent throughout the thoracic spine. No paraspinal abnormalities are evident.  IMPRESSION: No bone lesion, bony destruction or fracture. No significant encroachment on the central canal by bone or soft tissue. No acute findings are evident.   Electronically Signed   By: Andreas Newport M.D.   On: 10/21/2014 01:00   Ct Lumbar Spine Wo Contrast  10/21/2014   CLINICAL DATA:  81 year old male bilateral lower extremity weakness.  EXAM: CT LUMBAR SPINE WITHOUT CONTRAST  TECHNIQUE: Multidetector CT imaging of the lumbar spine was performed without intravenous contrast administration. Multiplanar CT image reconstructions were also generated.  COMPARISON:  None.  FINDINGS: Mild broad-based leftward curvature of the lumbar spine. No listhesis. Vertebral body heights are maintained. Diffuse disc space narrowing with endplate spurring and vacuum phenomenon throughout. No compression deformity or acute fracture. There is dense atherosclerosis of the included vasculature.  T12-L1: Facet arthropathy with mild neural foraminal stenosis. No stenosis central canal.  L1-L2: Posterior disc osteophyte complex and facet arthropathy, combination of which causes narrowing of the spinal canal and both neural foramina.  L2-L3: Posterior disc osteophyte complex and facet arthropathy. Combination of which causes stenosis of  spinal canal and both neural foramen.  L3-L4: Posterior disc osteophyte complex with probable central disc protrusion. Facet arthropathy. Combination of which causes significant narrowing of the spinal canal and both neural foramen.  L4-L5: Posterior disc osteophyte complex and facet arthropathy. Probable right paracentral disc protrusion. Question prior laminectomy defects. Combination of which causes narrowing of the spinal canal and both neural foramen.  L5-S1: Posterior disc osteophyte complex and facet arthropathy. Combination of this causes narrowing of the spinal canal bilateral neural foramen.  IMPRESSION: Diffuse degenerative disc disease and facet arthropathy, combination of which causes multi focal narrowing of the central canal and neural foramen at near every level. No acute osseous abnormalities are seen.   Electronically Signed   By: Jeb Levering M.D.   On: 10/21/2014 01:09    Review of Systems  Constitutional: Negative for fever and chills.  HENT: Negative for sore throat and tinnitus.   Eyes: Negative for blurred vision and redness.  Respiratory: Negative for cough  and shortness of breath.   Cardiovascular: Negative for chest pain, palpitations, orthopnea and PND.  Gastrointestinal: Negative for nausea, vomiting, abdominal pain and diarrhea.  Genitourinary: Negative for dysuria, urgency and frequency.  Musculoskeletal: Positive for falls. Negative for myalgias and joint pain.  Skin: Negative for rash.       No lesions  Neurological: Positive for weakness. Negative for speech change and focal weakness.  Endo/Heme/Allergies: Does not bruise/bleed easily.       No temperature intolerance  Psychiatric/Behavioral: Negative for depression and suicidal ideas.    Blood pressure 128/66, pulse 70, temperature 97.8 F (36.6 C), temperature source Oral, resp. rate 18, height 6' (1.829 m), weight 80.967 kg (178 lb 8 oz), SpO2 95 %. Physical Exam  Nursing note and vitals  reviewed. Constitutional: He is oriented to person, place, and time. He appears well-developed and well-nourished. No distress.  HENT:  Head: Normocephalic and atraumatic.  Mouth/Throat: Oropharynx is clear and moist.  Eyes: Conjunctivae and EOM are normal. Pupils are equal, round, and reactive to light. No scleral icterus.  Neck: Normal range of motion. Neck supple. No JVD present. No tracheal deviation present. No thyromegaly present.  Cardiovascular: Normal rate, normal heart sounds and intact distal pulses.  Exam reveals no gallop and no friction rub.   No murmur heard. Respiratory: Effort normal and breath sounds normal.  GI: Soft. Bowel sounds are normal. He exhibits no distension. There is no tenderness.  Suprapubic catheter in place; dressing clean and dry  Genitourinary:  Deferred  Musculoskeletal: Normal range of motion. He exhibits edema (trace).  Lymphadenopathy:    He has no cervical adenopathy.  Neurological: He is alert and oriented to person, place, and time. No cranial nerve deficit.  Skin: Skin is warm and dry. No rash noted. No erythema.  Psychiatric: He has a normal mood and affect. His behavior is normal. Judgment and thought content normal.     Assessment/Plan This is an 81 year old Caucasian male admitted for urinary tract infection and bilateral lower extremities weakness. 1. Urinary tract infection: As indwelling suprapubic catheter that was replaced last month. Per report, he developed a MRSA infection at the catheter site that has been treated. I suspect this may be the source of his current urinary tract infection which is likely causing his current weakness. The patient is received Rocephin in the emergency department. I will give the patient dose of vancomycin given his history of MRSA prior to transitioning him to Bactrim which we will to complete a 10 day course. 2. Lower extremity weakness: No spinal stenosis. The patient does not endorse any saddle numbness.  Weakness likely secondary to mild dehydration as well as subtle mental status changes that accompany his urinary tract infection. I will have physical therapy work with the patient while hospitalized. 3. Coronary artery disease: Stable. Continue metoprolol 4. Diabetes mellitus type 2: I have reduced the patient's basal insulin dose and place him on a sliding scale insulin while hospitalized. He may continue to receive Tradjenta. 5. Atrial fibrillation: Continue Eliquis (INR is not therapeutic however this is acceptable given his fall risk and stable management of atrial fibrillation for many years). 6. DVT prophylaxis: As above 7. GI prophylaxis: None The patient is a DO NOT RESUSCITATE. Time spent on admission orders and patient care approximately 35 minutes.  Harrie Foreman 10/21/2014, 5:24 AM

## 2014-10-21 NOTE — ED Notes (Signed)
Dr. Owens Shark returns to bedside to speak with patient regarding results and plan of care.

## 2014-10-22 DIAGNOSIS — N390 Urinary tract infection, site not specified: Secondary | ICD-10-CM | POA: Diagnosis not present

## 2014-10-22 DIAGNOSIS — L8990 Pressure ulcer of unspecified site, unspecified stage: Secondary | ICD-10-CM | POA: Insufficient documentation

## 2014-10-22 LAB — GLUCOSE, CAPILLARY
Glucose-Capillary: 109 mg/dL — ABNORMAL HIGH (ref 65–99)
Glucose-Capillary: 177 mg/dL — ABNORMAL HIGH (ref 65–99)
Glucose-Capillary: 129 mg/dL — ABNORMAL HIGH (ref 65–99)
Glucose-Capillary: 197 mg/dL — ABNORMAL HIGH (ref 65–99)

## 2014-10-22 NOTE — Progress Notes (Signed)
Patient having intermittent confusion and impulsiveness.  Patient able to be redirected at this time.  Will continue to monitor.  Lynnda Shields, RN

## 2014-10-22 NOTE — Progress Notes (Signed)
[facility] [place] NAME: [name]    MR#:  [id number]  DATE OF BIRTH:  1921-01-04  SUBJECTIVE:  CHIEF COMPLAINT:   Chief Complaint  Patient presents with  . Extremity Weakness    Right and Left Legs  much better today.  REVIEW OF SYSTEMS:  CONSTITUTIONAL: No fever, fatigue or weakness.  EYES: No blurred or double vision.  EARS, NOSE, AND THROAT: No tinnitus or ear pain. Has hearing deficit. RESPIRATORY: No cough, shortness of breath, wheezing or hemoptysis.  CARDIOVASCULAR: No chest pain, orthopnea, edema.  GASTROINTESTINAL: No nausea, vomiting, diarrhea or abdominal pain.  GENITOURINARY: No dysuria, hematuria.  ENDOCRINE: No polyuria, nocturia,  HEMATOLOGY: No anemia, easy bruising or bleeding SKIN: No rash or lesion. MUSCULOSKELETAL: No joint pain or arthritis.   NEUROLOGIC: No tingling, numbness, have c/o left lower limb weakness. Better now. PSYCHIATRY: No anxiety or depression.   ROS  DRUG ALLERGIES:   Allergies  Allergen Reactions  . Ambien [Zolpidem Tartrate] Other (See Comments)    Reaction:  Altered mental status   . Morphine And Related Other (See Comments)    Reaction:  Altered mental status   . Penicillins Other (See Comments)    Reaction:  Unknown     VITALS:  Blood pressure 123/52, pulse 65, temperature 97.8 F (36.6 C), temperature source Oral, resp. rate 21, height 6' (1.829 m), weight 81.92 kg (180 lb 9.6 oz), SpO2 95 %.  PHYSICAL EXAMINATION:  GENERAL:  81 y.o.-year-old patient lying in the bed with no acute distress.  EYES: Pupils equal, round, reactive to light and accommodation. No scleral icterus. Extraocular muscles intact.  HEENT: Head atraumatic, normocephalic. Oropharynx and nasopharynx clear.  NECK:  Supple, no jugular venous distention. No thyroid enlargement, no tenderness.  LUNGS: Normal breath sounds bilaterally, no wheezing, rales,rhonchi or crepitation. No use of accessory  muscles of respiration.  CARDIOVASCULAR: S1, S2 normal. No murmurs, rubs, or gallops.  ABDOMEN: Soft, nontender, nondistended. Bowel sounds present. No organomegaly or mass. Supra pubic catheter. EXTREMITIES: No pedal edema, cyanosis, or clubbing.  NEUROLOGIC: Cranial nerves II through XII are intact. Muscle strength 4/5 in all extremities . Gait not checked.  PSYCHIATRIC: The patient is alert and oriented x 3.  SKIN: No obvious rash, lesion, or ulcer.   Physical Exam LABORATORY PANEL:   CBC  Recent Labs Lab 10/20/14 1957  WBC 8.2  HGB 10.8*  HCT 33.4*  PLT 142*   ------------------------------------------------------------------------------------------------------------------  Chemistries   Recent Labs Lab 10/20/14 1957  NA 130*  K 5.0  CL 99*  CO2 25  GLUCOSE 158*  BUN 39*  CREATININE 1.78*  CALCIUM 9.0  AST 27  ALT 26  ALKPHOS 74  BILITOT 0.7   ------------------------------------------------------------------------------------------------------------------  Cardiac Enzymes  Recent Labs Lab 10/20/14 1957 10/21/14 0304  TROPONINI 0.04* 0.03   ------------------------------------------------------------------------------------------------------------------  RADIOLOGY:  Ct Head Wo Contrast  10/20/2014   CLINICAL DATA:  Sudden onset of bilateral lower extremity weakness.  EXAM: CT HEAD WITHOUT CONTRAST  TECHNIQUE: Contiguous axial images were obtained from the base of the skull through the vertex without intravenous contrast.  COMPARISON:  None  FINDINGS: There is no intracranial hemorrhage, mass or evidence of acute infarction. There is no extra-axial fluid collection. There is encephalomalacia due to remote right parietal infarction there is white matter hypodensity consistent with chronic microvascular disease, and there is moderate generalized atrophy. There is no bony abnormality. The visible paranasal sinuses are clear.  IMPRESSION: No  acute findings.  Remote right parietal infarction. Generalized atrophy and chronic microvascular ischemic disease.   Electronically Signed   By: Andreas Newport M.D.   On: 10/20/2014 20:49   Ct Cervical Spine Wo Contrast  10/21/2014   CLINICAL DATA:  81 year old male with bilateral lower extremity weakness.  EXAM: CT CERVICAL SPINE WITHOUT CONTRAST  TECHNIQUE: Multidetector CT imaging of the cervical spine was performed without intravenous contrast. Multiplanar CT image reconstructions were also generated.  COMPARISON:  None.  FINDINGS: Grade 1 anterolisthesis of C6 on C7 appears degenerative. The alignment is otherwise maintained. There is diffuse disc space narrowing throughout with endplate spurring. Peripherally calcified disc central/eccentric to the left at C4-C5 causing mild narrowing of the spinal canal. No critical stenosis. Diffuse multilevel facet arthropathy. Multilevel neural foraminal stenosis throughout. The bones are under mineralized. Vertebral body heights are preserved. There is no fracture. The dens is intact. There are no jumped or perched facets. No prevertebral soft tissue edema. Atherosclerosis noted of the carotid arteries.  IMPRESSION: Multilevel degenerative disc disease and facet arthropathy throughout the cervical spine with multifocal neural foraminal narrowing and mild narrowing the spinal canal and C4-C5. No critical stenosis. No acute bony abnormality.   Electronically Signed   By: Jeb Levering M.D.   On: 10/21/2014 00:57   Ct Thoracic Spine Wo Contrast  10/21/2014   CLINICAL DATA:  Leg weakness, left worse than right.  EXAM: CT THORACIC SPINE WITHOUT CONTRAST  TECHNIQUE: Multidetector CT imaging of the thoracic spine was performed without intravenous contrast administration. Multiplanar CT image reconstructions were also generated.  COMPARISON:  None.  FINDINGS: The thoracic vertebral column is intact. There is no evidence of acute fracture. There is no bone lesion or bony destruction.  Posterior elements are unremarkable. There are mild degenerative disc changes. The central canal is patent throughout the thoracic spine. No paraspinal abnormalities are evident.  IMPRESSION: No bone lesion, bony destruction or fracture. No significant encroachment on the central canal by bone or soft tissue. No acute findings are evident.   Electronically Signed   By: Andreas Newport M.D.   On: 10/21/2014 01:00   Ct Lumbar Spine Wo Contrast  10/21/2014   CLINICAL DATA:  81 year old male bilateral lower extremity weakness.  EXAM: CT LUMBAR SPINE WITHOUT CONTRAST  TECHNIQUE: Multidetector CT imaging of the lumbar spine was performed without intravenous contrast administration. Multiplanar CT image reconstructions were also generated.  COMPARISON:  None.  FINDINGS: Mild broad-based leftward curvature of the lumbar spine. No listhesis. Vertebral body heights are maintained. Diffuse disc space narrowing with endplate spurring and vacuum phenomenon throughout. No compression deformity or acute fracture. There is dense atherosclerosis of the included vasculature.  T12-L1: Facet arthropathy with mild neural foraminal stenosis. No stenosis central canal.  L1-L2: Posterior disc osteophyte complex and facet arthropathy, combination of which causes narrowing of the spinal canal and both neural foramina.  L2-L3: Posterior disc osteophyte complex and facet arthropathy. Combination of which causes stenosis of spinal canal and both neural foramen.  L3-L4: Posterior disc osteophyte complex with probable central disc protrusion. Facet arthropathy. Combination of which causes significant narrowing of the spinal canal and both neural foramen.  L4-L5: Posterior disc osteophyte complex and facet arthropathy. Probable right paracentral disc protrusion. Question prior laminectomy defects. Combination of which causes narrowing of the spinal canal and both neural foramen.  L5-S1: Posterior disc osteophyte complex and facet arthropathy.  Combination of this causes narrowing of the spinal canal bilateral neural foramen.  IMPRESSION: Diffuse degenerative  disc disease and facet arthropathy, combination of which causes multi focal narrowing of the central canal and neural foramen at near every level. No acute osseous abnormalities are seen.   Electronically Signed   By: Jeb Levering M.D.   On: 10/21/2014 01:09    ASSESSMENT AND PLAN:    1. Urinary tract infection: As indwelling suprapubic catheter that was replaced last month. Per report, he developed a MRSA infection at the catheter site that has been treated.  urinary tract infection which is likely causing his current weakness.   Bactrim which we will to complete a 10 day course.   Wait for final cx report. 2. Lower extremity weakness: No spinal stenosis. The patient does not endorse any saddle numbness. Weakness likely secondary to mild dehydration as well as  urinary tract infection. Much improved now.  physical therapy work with the patient while hospitalized.   Suggested SNF, likely can go tomorrow- after final urine cx report. 3. Coronary artery disease: Stable. Continue metoprolol 4. Diabetes mellitus type 2:   reduced the patient's basal insulin dose and place him on a sliding scale insulin while hospitalized. He may continue to receive Tradjenta. 5. Atrial fibrillation: Continue Eliquis. 6. DVT prophylaxis: As above. 7. GI prophylaxis: None.  All the records are reviewed and case discussed with Care Management/Social Workerr. Management plans discussed with the patient, family and they are in agreement.  CODE STATUS: DNR  TOTAL TIME TAKING CARE OF THIS PATIENT: 30 minutes.   POSSIBLE D/C IN 1-2 DAYS, DEPENDING ON CLINICAL CONDITION.   Vaughan Basta M.D on 10/22/2014   Between 7am to 6pm - Pager - [phone]  After 6pm go to [email or web address] - password [email or web address] [facility]  Office  [phone]  CC: Primary care physician;  [name]., MD

## 2014-10-22 NOTE — Progress Notes (Signed)
Physical Therapy Treatment Patient Details Name: [name] MRN: [id number] DOB: June 27, 1920 Today's Date: 10/22/2014    History of Present Illness presented to ER secondary to acute onset of LE weakness (near fall onto chair surface); admitted with UTI.  X-ray/CT imaging of head, cervical, thoracic and lumbar spine negative for acute change (was significant for chronic DDD and facet arthropathy).    PT Comments    Patient with marked improvement of L LE strength (at least 3-/5) and ROM this date.  Able to complete transition to short-sitting edge of bed with min assist; sit/stand and bed/chair with RW, mod assist +1-2 for patient safety and comfort.  Very unsteady; unsafe to complete without RW and +2. Constant encouragement required throughout; excessively anxious about mobility and fearful of falling.  Patient tearful upon transfer to chair, insisting that "there is nobody at my home place that can help me like that".  Daughter/therapist provided support/encouragement; Education officer, museum informed/aware of patient concerns.   Follow Up Recommendations  SNF     Equipment Recommendations  Rolling walker with 5" wheels    Recommendations for Other Services       Precautions / Restrictions Precautions Precautions: Fall Restrictions Weight Bearing Restrictions: No    Mobility  Bed Mobility Overal bed mobility: Needs Assistance Bed Mobility: Supine to Sit     Supine to sit: Min assist     General bed mobility comments: min assist for truncal elevation and anterior weight translation  Transfers Overall transfer level: Needs assistance Equipment used: Rolling walker (2 wheeled) Transfers: Sit to/from Stand Sit to Stand: Min assist;Mod assist;+2 physical assistance         General transfer comment: patient extremely fearful; second person for patient comfort and overall safety  Ambulation/Gait Ambulation/Gait assistance: Mod assist;+2 physical assistance Ambulation  Distance (Feet): 4 Feet Assistive device: Standard walker       General Gait Details: 3 point, step to gait pattern; mod assist for walker position/management.  Decreased step height/length L LE. Very poor balance reactions; unsafe to attempt without AD and +2.   Stairs            Wheelchair Mobility    Modified Rankin (Stroke Patients Only)       Balance Overall balance assessment: Needs assistance Sitting-balance support: No upper extremity supported;Feet supported Sitting balance-Leahy Scale: Good     Standing balance support: Bilateral upper extremity supported Standing balance-Leahy Scale: Poor                      Cognition Arousal/Alertness: Awake/alert Behavior During Therapy: Anxious Overall Cognitive Status: Within Functional Limits for tasks assessed                      Exercises Other Exercises Other Exercises: Sit/stand x3 with RW, mod assist +1-2 for safety and patient confidence.  Assist from therapist for LE placement, UE placement and overall lift off/balance.  tolerating x10-15 seconds each trial prior to need for seated rest period.  Constant encouragement required throughout; excessively anxious about mobility and fearful of falling    General Comments        Pertinent Vitals/Pain Pain Assessment: No/denies pain    Home Living                      Prior Function            PT Goals (current goals can now be found in the care plan section)  Acute Rehab PT Goals Patient Stated Goal: "to try a little" PT Goal Formulation: With patient Time For Goal Achievement: 11/04/14 Potential to Achieve Goals: Fair Progress towards PT goals: Progressing toward goals    Frequency  Min 2X/week    PT Plan Current plan remains appropriate    Co-evaluation             End of Session Equipment Utilized During Treatment: Gait belt Activity Tolerance: Patient tolerated treatment well Patient left: with call bell/phone  within reach;with family/visitor present;in chair;with chair alarm set     Time: 1425-1456 PT Time Calculation (min) (ACUTE ONLY): 31 min  Charges:  $Gait Training: 8-22 mins $Therapeutic Activity: 8-22 mins                    G Codes:      Micaiah Remillard H. Owens Shark, PT, DPT, NCS 10/22/2014, 3:10 PM [phone]

## 2014-10-22 NOTE — Discharge Summary (Addendum)
[facility] [place] NAME: [name]    MR#:  [id number]  DATE OF BIRTH:  03-23-1921  DATE OF ADMISSION:  10/20/2014 ADMITTING PHYSICIAN: Harrie Foreman, MD  DATE OF DISCHARGE: 10/24/2014  PRIMARY CARE PHYSICIAN: [name]., MD    ADMISSION DIAGNOSIS:  Lower extremity weakness [R29.898]  DISCHARGE DIAGNOSIS:  Active Problems:   UTI (lower urinary tract infection)   Pressure ulcer  generalized weakness.  SECONDARY DIAGNOSIS:   Past Medical History  Diagnosis Date  . Coronary artery disease   . Heart attack   . Diabetes mellitus type 2, uncomplicated   . Atrial fibrillation   . Skin cancer   . Chronic kidney disease   . CHF (congestive heart failure)     HOSPITAL COURSE:   1. Urinary tract infection: This is likely the cause of patient's generalized weakness. Patient recently had a suprapubic catheter changed out at [facility]. He had a MRSA infection around the catheter site which has been treated now resolved. He then presented with a urinary tract infection. -Patient's urine cultures are currently growing Pseudomonas and it has 2 different species. Presently patient is being discharged on oral Levaquin with follow-up with his primary care physician as an outpatient. -Patient is clinically afebrile and hemodynamically stable.   2. Lower extremity weakness: No spinal stenosis. The patient does not endorse any saddle numbness. Weakness likely secondary to mild dehydration as well as urinary tract infection. Much improved now. -Patient was seen by physical therapy and they recommended short-term rehabilitation which is where he is presently being discharged.  3. Coronary artery disease: No chest pain Continue metoprolol, simvastatin  4. Diabetes mellitus type 2-no evidence of hypoglycemia. Continue Lantus, Januvia.  5. Atrial fibrillation: Rate controlled and continue metoprolol. Continue Eliquis.  6.   Chronic pain-continue oxycodone, tramadol as needed.  DISCHARGE CONDITIONS:   stable  CONSULTS OBTAINED:     DRUG ALLERGIES:   Allergies  Allergen Reactions  . Ambien [Zolpidem Tartrate] Other (See Comments)    Reaction:  Altered mental status   . Morphine And Related Other (See Comments)    Reaction:  Altered mental status   . Penicillins Other (See Comments)    Reaction:  Unknown     DISCHARGE MEDICATIONS:   Current Discharge Medication List    START taking these medications   Details  levofloxacin (LEVAQUIN) 250 MG tablet Take 1 tablet (250 mg total) by mouth daily. Qty: 7 tablet      CONTINUE these medications which have CHANGED   Details  !! oxyCODONE (OXY IR/ROXICODONE) 5 MG immediate release tablet Take 1 tablet (5 mg total) by mouth every 4 (four) hours as needed for severe pain. Qty: 30 tablet, Refills: 0     !! - Potential duplicate medications found. Please discuss with provider.    CONTINUE these medications which have NOT CHANGED   Details  acetaminophen (TYLENOL) 325 MG tablet Take 650 mg by mouth every 4 (four) hours as needed for mild pain, fever or headache.     apixaban (ELIQUIS) 2.5 MG TABS tablet Take 2.5 mg by mouth 2 (two) times daily.    Cholecalciferol (VITAMIN D3) 2000 UNITS capsule Take 2,000 Units by mouth daily.    docusate sodium (COLACE) 100 MG capsule Take 100 mg by mouth 2 (two) times daily.    escitalopram (LEXAPRO) 10 MG tablet Take 10 mg by mouth daily.    folic acid (FOLVITE) 623 MCG tablet Take 400  mcg by mouth daily.    insulin glargine (LANTUS) 100 UNIT/ML injection Inject 12 Units into the skin at bedtime.     loperamide (IMODIUM) 2 MG capsule Take 2-4 mg by mouth every 3 (three) hours as needed for diarrhea or loose stools.    LORazepam (ATIVAN) 0.5 MG tablet Take 0.5 mg by mouth every 4 (four) hours as needed for anxiety.    metoprolol succinate (TOPROL-XL) 50 MG 24 hr tablet Take 50 mg by mouth daily.     Multiple  Vitamins-Minerals (PRESERVISION AREDS 2 PO) Take 1 capsule by mouth 2 (two) times daily.    Multiple Vitamins-Minerals (THEREMS-M PO) Take 1 tablet by mouth daily.    !! oxyCODONE (OXY IR/ROXICODONE) 5 MG immediate release tablet Take 5 mg by mouth every 4 (four) hours as needed for severe pain.    phenazopyridine (PYRIDIUM) 200 MG tablet Take 1 tablet (200 mg total) by mouth 3 (three) times daily as needed for pain. Qty: 10 tablet, Refills: 0    senna (SENOKOT) 8.6 MG TABS tablet Take 1 tablet by mouth 2 (two) times daily as needed for mild constipation.    senna-docusate (SENNA S) 8.6-50 MG per tablet Take 1 tablet by mouth 2 (two) times daily.    silver sulfADIAZINE (SILVADENE) 1 % cream Apply 1 application topically daily.    simvastatin (ZOCOR) 40 MG tablet Take 40 mg by mouth at bedtime.     sitaGLIPtin (JANUVIA) 50 MG tablet Take 50 mg by mouth daily.    torsemide (DEMADEX) 5 MG tablet Take 5 mg by mouth daily.    traMADol (ULTRAM) 50 MG tablet Take 50 mg by mouth every 6 (six) hours as needed for moderate pain.     traZODone (DESYREL) 100 MG tablet Take 100 mg by mouth at bedtime.    vitamin B-12 (CYANOCOBALAMIN) 1000 MCG tablet Take 1,000 mcg by mouth daily.     !! - Potential duplicate medications found. Please discuss with provider.    STOP taking these medications     sulfamethoxazole-trimethoprim (BACTRIM DS,SEPTRA DS) 800-160 MG per tablet          DISCHARGE INSTRUCTIONS:    Follow with PMD in 1 week.  If you experience worsening of your admission symptoms, develop shortness of breath, life threatening emergency, suicidal or homicidal thoughts you must seek medical attention immediately by calling 911 or calling your MD immediately  if symptoms less severe.  You Must read complete instructions/literature along with all the possible adverse reactions/side effects for all the Medicines you take and that have been prescribed to you. Take any new Medicines after  you have completely understood and accept all the possible adverse reactions/side effects.   Please note  You were cared for by a hospitalist during your hospital stay. If you have any questions about your discharge medications or the care you received while you were in the hospital after you are discharged, you can call the unit and asked to speak with the hospitalist on call if the hospitalist that took care of you is not available. Once you are discharged, your primary care physician will handle any further medical issues. Please note that NO REFILLS for any discharge medications will be authorized once you are discharged, as it is imperative that you return to your primary care physician (or establish a relationship with a primary care physician if you do not have one) for your aftercare needs so that they can reassess your need for medications and monitor your  lab values.    Today   Clinically doing well and denies any complaints presently. Son at bedside. Afebrile and hemodynamically stable.  VITAL SIGNS:  Blood pressure 117/55, pulse 63, temperature 98.7 F (37.1 C), temperature source Oral, resp. rate 22, height 6' (1.829 m), weight 83.099 kg (183 lb 3.2 oz), SpO2 93 %.  I/O:    Intake/Output Summary (Last 24 hours) at 10/24/14 1032 Last data filed at 10/24/14 0930  Gross per 24 hour  Intake   2125 ml  Output   4350 ml  Net  -2225 ml    PHYSICAL EXAMINATION:  GENERAL: 81 y.o.-year-old patient lying in the bed with no acute distress.  EYES: Pupils equal, round, reactive to light and accommodation. No scleral icterus. Extraocular muscles intact.  HEENT: Head atraumatic, normocephalic. Oropharynx and nasopharynx clear.  NECK: Supple, no jugular venous distention. No thyroid enlargement, no tenderness.  LUNGS: Normal breath sounds bilaterally, no wheezing, rales,rhonchi. No use of accessory muscles of respiration.  CARDIOVASCULAR: S1, S2, RRR. No murmurs, rubs, or gallops.   ABDOMEN: Soft, nontender, nondistended. Bowel sounds present. No organomegaly or mass. + Supra pubic catheter. EXTREMITIES: No pedal edema, cyanosis, or clubbing.  NEUROLOGIC: Cranial nerves II through XII are intact. Globally weak. No focal motor or sensory deficits appreciated bilaterally.  PSYCHIATRIC: The patient is alert and oriented x 3. Good affect SKIN: No obvious rash, lesion, or ulcer.   DATA REVIEW:   CBC  Recent Labs Lab 10/20/14 1957  WBC 8.2  HGB 10.8*  HCT 33.4*  PLT 142*    Chemistries   Recent Labs Lab 10/20/14 1957 10/23/14 0454  NA 130* 138  K 5.0 4.2  CL 99* 110  CO2 25 24  GLUCOSE 158* 112*  BUN 39* 24*  CREATININE 1.78* 1.46*  CALCIUM 9.0 8.4*  AST 27  --   ALT 26  --   ALKPHOS 74  --   BILITOT 0.7  --     Cardiac Enzymes  Recent Labs Lab 10/21/14 0304  TROPONINI 0.03    Microbiology Results  Results for orders placed or performed during the hospital encounter of 10/20/14  Urine culture     Status: None (Preliminary result)   Collection Time: 10/20/14  7:45 PM  Result Value Ref Range Status   Specimen Description URINE, CLEAN CATCH  Final   Special Requests NONE  Final   Culture   Final    >=100,000 COLONIES/mL GRAM NEGATIVE RODS IDENTIFICATION AND SUSCEPTIBILITIES TO FOLLOW REPEATING TO CONFIRM IDENTIFICATION    Report Status PENDING  Incomplete  MRSA PCR Screening     Status: None   Collection Time: 10/21/14  5:00 AM  Result Value Ref Range Status   MRSA by PCR NEGATIVE NEGATIVE Final    Comment:        The GeneXpert MRSA Assay (FDA approved for NASAL specimens only), is one component of a comprehensive MRSA colonization surveillance program. It is not intended to diagnose MRSA infection nor to guide or monitor treatment for MRSA infections.     RADIOLOGY:  No results found.  Management plans discussed with the patient, family and they are in agreement.  CODE STATUS:     Code Status Orders         Start     Ordered   10/21/14 0516  Do not attempt resuscitation (DNR)   Continuous    Question Answer Comment  In the event of cardiac or respiratory ARREST Do not call a "code blue"  In the event of cardiac or respiratory ARREST Do not perform Intubation, CPR, defibrillation or ACLS   In the event of cardiac or respiratory ARREST Use medication by any route, position, wound care, and other measures to relive pain and suffering. May use oxygen, suction and manual treatment of airway obstruction as needed for comfort.      10/21/14 0515      TOTAL TIME TAKING CARE OF THIS PATIENT: 40 minutes.    Henreitta Leber M.D on 10/24/2014 at 10:32 AM  Between 7am to 6pm - Pager - [phone]  After 6pm go to [email or web address] - password [email or web address] [facility]  Office  [phone]  CC: Primary care physician; [name]., MD

## 2014-10-23 DIAGNOSIS — N390 Urinary tract infection, site not specified: Secondary | ICD-10-CM | POA: Diagnosis not present

## 2014-10-23 LAB — BASIC METABOLIC PANEL
ANION GAP: 4 — AB (ref 5–15)
BUN: 24 mg/dL — AB (ref 6–20)
CO2: 24 mmol/L (ref 22–32)
Calcium: 8.4 mg/dL — ABNORMAL LOW (ref 8.9–10.3)
Chloride: 110 mmol/L (ref 101–111)
Creatinine, Ser: 1.46 mg/dL — ABNORMAL HIGH (ref 0.61–1.24)
GFR calc non Af Amer: 40 mL/min — ABNORMAL LOW (ref >60)
GFR, EST AFRICAN AMERICAN: 46 mL/min — AB (ref >60)
Glucose, Bld: 112 mg/dL — ABNORMAL HIGH (ref 65–99)
POTASSIUM: 4.2 mmol/L (ref 3.5–5.1)
Sodium: 138 mmol/L (ref 135–145)

## 2014-10-23 LAB — GLUCOSE, CAPILLARY
Glucose-Capillary: 122 mg/dL — ABNORMAL HIGH (ref 65–99)
Glucose-Capillary: 160 mg/dL — ABNORMAL HIGH (ref 65–99)
Glucose-Capillary: 127 mg/dL — ABNORMAL HIGH (ref 65–99)
Glucose-Capillary: 171 mg/dL — ABNORMAL HIGH (ref 65–99)

## 2014-10-23 NOTE — Progress Notes (Signed)
[place] [facility] [place] NAME: [name]    MR#:  [id number]  DATE OF BIRTH:  01-07-1921  SUBJECTIVE:  CHIEF COMPLAINT:   Chief Complaint  Patient presents with  . Extremity Weakness    Right and Left Legs   Generalized weakness much improved. Afebrile, hemodynamically stable. Son at bedside. Urine cultures are still pending.  REVIEW OF SYSTEMS:   Review of Systems  Constitutional: Negative for fever and chills.  HENT: Negative for congestion and tinnitus.   Eyes: Negative for blurred vision and double vision.  Respiratory: Negative for cough, shortness of breath and wheezing.   Cardiovascular: Negative for chest pain, orthopnea and PND.  Gastrointestinal: Negative for nausea, vomiting, abdominal pain and diarrhea.  Genitourinary: Negative for dysuria and hematuria.  Neurological: Negative for dizziness, sensory change and focal weakness.  All other systems reviewed and are negative.   DRUG ALLERGIES:   Allergies  Allergen Reactions  . Ambien [Zolpidem Tartrate] Other (See Comments)    Reaction:  Altered mental status   . Morphine And Related Other (See Comments)    Reaction:  Altered mental status   . Penicillins Other (See Comments)    Reaction:  Unknown     VITALS:  Blood pressure 148/82, pulse 64, temperature 97.5 F (36.4 C), temperature source Oral, resp. rate 16, height 6' (1.829 m), weight 83.507 kg (184 lb 1.6 oz), SpO2 100 %.  PHYSICAL EXAMINATION:  GENERAL:  81 y.o.-year-old patient lying in the bed with no acute distress.  EYES: Pupils equal, round, reactive to light and accommodation. No scleral icterus. Extraocular muscles intact.  HEENT: Head atraumatic, normocephalic. Oropharynx and nasopharynx clear.  NECK:  Supple, no jugular venous distention. No thyroid enlargement, no tenderness.  LUNGS: Normal breath sounds bilaterally, no wheezing, rales,rhonchi or crepitation. No use of accessory muscles of  respiration.  CARDIOVASCULAR: S1, S2 RRR. 2/6 systolic ejection murmur at the right sternal border, no rubs, clicks. ABDOMEN: Soft, nontender, nondistended. Bowel sounds present. No organomegaly or mass. + Supra pubic catheter. EXTREMITIES: No pedal edema, cyanosis, or clubbing.  NEUROLOGIC: Cranial nerves II through XII are intact. No focal motor/sensory deficits appreciated bilaterally. Globally weak.  PSYCHIATRIC: The patient is alert and oriented x 3. Good affect  SKIN: No obvious rash, lesion, or ulcer.   Physical Exam LABORATORY PANEL:   CBC  Recent Labs Lab 10/20/14 1957  WBC 8.2  HGB 10.8*  HCT 33.4*  PLT 142*   ------------------------------------------------------------------------------------------------------------------  Chemistries   Recent Labs Lab 10/20/14 1957 10/23/14 0454  NA 130* 138  K 5.0 4.2  CL 99* 110  CO2 25 24  GLUCOSE 158* 112*  BUN 39* 24*  CREATININE 1.78* 1.46*  CALCIUM 9.0 8.4*  AST 27  --   ALT 26  --   ALKPHOS 74  --   BILITOT 0.7  --    ------------------------------------------------------------------------------------------------------------------  Cardiac Enzymes  Recent Labs Lab 10/20/14 1957 10/21/14 0304  TROPONINI 0.04* 0.03   ------------------------------------------------------------------------------------------------------------------  RADIOLOGY:  No results found.  ASSESSMENT AND PLAN:    1. Urinary tract infection: Likely cause of patient's weakness. Patient is a chronic suprapubic catheter. -Clinically afebrile, hemodynamically stable. Feels better. -Continue Bactrim for now. Called microbiology this morning and they said patient had 2 different organisms growing and it needs to be re-plated for identification for tomorrow morning.  2. Lower extremity weakness:  Weakness likely secondary to mild dehydration as well as  urinary tract infection. Much improved now. -  Seen by [place] physical therapy and  recommended skilled nursing facility and patient to me discharged to tomorrow once the final urine cultures are back.  3. Coronary artery disease: Stable. Continue metoprolol, simvastatin  4. Diabetes mellitus type 2-blood sugar stable. Continue Lantus, sliding scale insulin, Tradjenta.  5. Atrial fibrillation: Rate controlled and continue metoprolol. -Continue Eliquis.  #6 hyperlipidemia-continue simvastatin.   All the records are reviewed and case discussed with Care Management/Social Workerr. Management plans discussed with the patient, family and they are in agreement.  CODE STATUS: DNR  TOTAL TIME TAKING CARE OF THIS PATIENT: 30 minutes.   POSSIBLE D/C to SNF tomorrow once final urine cultures are back tomorrow.   Henreitta Leber M.D on 10/23/2014   Between 7am to 6pm - Pager - [phone]  After 6pm go to [email or web address] - password [email or web address] [facility]  Office  [phone]  CC: Primary care physician; [name]., MD

## 2014-10-23 NOTE — Clinical Social Work Note (Signed)
CSW notified RN that pt would DC today back to [facility].  Via EMS unless family requests otherwise.  CSW signing off

## 2014-10-24 DIAGNOSIS — N390 Urinary tract infection, site not specified: Secondary | ICD-10-CM | POA: Diagnosis not present

## 2014-10-24 LAB — GLUCOSE, CAPILLARY
Glucose-Capillary: 110 mg/dL — ABNORMAL HIGH (ref 65–99)
Glucose-Capillary: 137 mg/dL — ABNORMAL HIGH (ref 65–99)

## 2014-10-24 MED ORDER — TRAMADOL HCL 50 MG PO TABS: 50.0000 mg | ORAL_TABLET | Freq: Four times a day (QID) | ORAL | Status: DC | PRN

## 2014-10-24 MED ORDER — OXYCODONE HCL 5 MG PO TABS: 5.0000 mg | ORAL_TABLET | ORAL | Status: DC | PRN

## 2014-10-24 MED ORDER — SODIUM CHLORIDE 0.9 % IJ SOLN: 3.0000 mL | INTRAMUSCULAR | Status: DC | PRN

## 2014-10-24 MED ORDER — LEVOFLOXACIN 250 MG PO TABS: 250.0000 mg | ORAL_TABLET | Freq: Every day | ORAL | Status: DC

## 2014-10-24 MED ORDER — LEVOFLOXACIN 500 MG PO TABS
500.0000 mg | ORAL_TABLET | Freq: Once | ORAL | Status: AC
  Administered 2014-10-24: 500 mg via ORAL
  Filled 2014-10-24: qty 1

## 2014-10-24 MED ORDER — LACTULOSE 10 GM/15ML PO SOLN
30.0000 g | Freq: Once | ORAL | Status: AC
  Administered 2014-10-24: 30 g via ORAL
  Filled 2014-10-24: qty 60

## 2014-10-24 MED ORDER — MINERAL OIL RE ENEM
1.0000 | ENEMA | Freq: Once | RECTAL | Status: AC
  Administered 2014-10-24: 1 via RECTAL

## 2014-10-24 MED ORDER — SODIUM CHLORIDE 0.9 % IJ SOLN
3.0000 mL | Freq: Two times a day (BID) | INTRAMUSCULAR | Status: DC
  Administered 2014-10-24 (×2): 3 mL via INTRAVENOUS

## 2014-10-24 NOTE — Progress Notes (Signed)
Pt in NAD, skin warm and dry, respirations even and unlabored.  VSS, pt denies any pain or discomfort at this time.  IV and telemetry discontinued per policy and procedure.  Report called to [place] [facility], pt awaiting EMS transport.

## 2014-10-24 NOTE — Plan of Care (Signed)
Problem: Problem: Skin/Wound Progression Goal: ADEQUATE MOBILITY PROGRESSION Outcome: Not Progressing Pt unable to tolerate q2h turning.

## 2014-10-24 NOTE — Clinical Social Work Note (Signed)
CSW notified facility, pt and pt's son in the room that pt would DC [place] via EMS to [facility].  CSW signing off unless further needs arise.

## 2014-10-24 NOTE — Plan of Care (Signed)
Problem: Phase III Progression Outcomes Goal: Pain controlled on oral analgesia Outcome: Not Progressing Pt continues to have unrelieved pain in left knee.

## 2014-10-24 NOTE — Progress Notes (Addendum)
   10/24/14 1400  Clinical Encounter Type  Visited With Family  Visit Type Spiritual support  Spiritual Encounters  Spiritual Needs Prayer  Stress Factors  Family Stress Factors None identified   Status: UTI (lower urinary tract infection) 81 yr male Family: Son present Visit Assessment: The chaplain spoke with his son twice and gave encouraging words. His son says his Dad is fine, he just cannot walk, he said. His son also said that he was discharging back to [facility].  Pastoral care can be reached via pager [phone] or online

## 2014-10-25 ENCOUNTER — Encounter
Admission: RE | Admit: 2014-10-25 | Discharge: 2014-10-25 | Disposition: A | Discharge status: Home / Self Care | Payer: Medicare Other | Source: Ambulatory Visit | Attending: Internal Medicine | Admitting: Internal Medicine

## 2014-10-25 LAB — URINE CULTURE: Culture: >=100000

## 2014-10-29 ENCOUNTER — Other Ambulatory Visit
Admission: RE | Admit: 2014-10-29 | Discharge: 2014-10-29 | Disposition: A | Discharge status: Home / Self Care | Payer: No Typology Code available for payment source | Source: Ambulatory Visit | Attending: Gerontology | Admitting: Gerontology

## 2014-10-29 DIAGNOSIS — R42 Dizziness and giddiness: Secondary | ICD-10-CM | POA: Insufficient documentation

## 2014-10-29 LAB — CBC WITH DIFFERENTIAL/PLATELET
BASOS PCT: 1 %
Basophils Absolute: 0 10*3/uL (ref 0–0.1)
EOS ABS: 0.2 10*3/uL (ref 0–0.7)
Eosinophils Relative: 2 %
HCT: 36.1 % — ABNORMAL LOW (ref 40.0–52.0)
Hemoglobin: 11.7 g/dL — ABNORMAL LOW (ref 13.0–18.0)
LYMPHS ABS: 1.2 10*3/uL (ref 1.0–3.6)
Lymphocytes Relative: 17 %
MCH: 28.2 pg (ref 26.0–34.0)
MCHC: 32.5 g/dL (ref 32.0–36.0)
MCV: 86.7 fL (ref 80.0–100.0)
MONO ABS: 0.6 10*3/uL (ref 0.2–1.0)
Monocytes Relative: 8 %
NEUTROS ABS: 5.4 10*3/uL (ref 1.4–6.5)
Neutrophils Relative %: 72 %
Platelets: 158 10*3/uL (ref 150–440)
RBC: 4.16 MIL/uL — ABNORMAL LOW (ref 4.40–5.90)
RDW: 17.2 % — ABNORMAL HIGH (ref 11.5–14.5)
WBC: 7.3 10*3/uL (ref 3.8–10.6)

## 2014-10-29 LAB — COMPREHENSIVE METABOLIC PANEL
ALBUMIN: 2.9 g/dL — AB (ref 3.5–5.0)
ALT: 15 U/L — ABNORMAL LOW (ref 17–63)
AST: 20 U/L (ref 15–41)
Alkaline Phosphatase: 74 U/L (ref 38–126)
Anion gap: 7 (ref 5–15)
BILIRUBIN TOTAL: 0.4 mg/dL (ref 0.3–1.2)
BUN: 32 mg/dL — AB (ref 6–20)
CALCIUM: 9 mg/dL (ref 8.9–10.3)
CO2: 28 mmol/L (ref 22–32)
CREATININE: 1.37 mg/dL — AB (ref 0.61–1.24)
Chloride: 99 mmol/L — ABNORMAL LOW (ref 101–111)
GFR calc Af Amer: 50 mL/min — ABNORMAL LOW (ref >60)
GFR calc non Af Amer: 43 mL/min — ABNORMAL LOW (ref >60)
Glucose, Bld: 105 mg/dL — ABNORMAL HIGH (ref 65–99)
Potassium: 5 mmol/L (ref 3.5–5.1)
Sodium: 134 mmol/L — ABNORMAL LOW (ref 135–145)
Total Protein: 5.8 g/dL — ABNORMAL LOW (ref 6.5–8.1)

## 2014-10-29 LAB — VITAMIN B12: Vitamin B-12: 524 pg/mL (ref 180–914)

## 2014-11-02 LAB — CBC WITH DIFFERENTIAL/PLATELET
Basophils Absolute: 0.1 10*3/uL (ref 0–0.1)
Basophils Relative: 1 %
EOS ABS: 0.2 10*3/uL (ref 0–0.7)
Eosinophils Relative: 3 %
HEMATOCRIT: 37.4 % — AB (ref 40.0–52.0)
Hemoglobin: 12 g/dL — ABNORMAL LOW (ref 13.0–18.0)
Lymphocytes Relative: 15 %
Lymphs Abs: 1.2 10*3/uL (ref 1.0–3.6)
MCH: 28.2 pg (ref 26.0–34.0)
MCHC: 32.2 g/dL (ref 32.0–36.0)
MCV: 87.5 fL (ref 80.0–100.0)
MONO ABS: 0.6 10*3/uL (ref 0.2–1.0)
Monocytes Relative: 7 %
Neutro Abs: 6 10*3/uL (ref 1.4–6.5)
Neutrophils Relative %: 74 %
Platelets: 166 10*3/uL (ref 150–440)
RBC: 4.28 MIL/uL — AB (ref 4.40–5.90)
RDW: 17.5 % — ABNORMAL HIGH (ref 11.5–14.5)
WBC: 8.2 10*3/uL (ref 3.8–10.6)

## 2014-11-02 LAB — COMPREHENSIVE METABOLIC PANEL
ALBUMIN: 2.9 g/dL — AB (ref 3.5–5.0)
ALK PHOS: 80 U/L (ref 38–126)
ALT: 17 U/L (ref 17–63)
AST: 26 U/L (ref 15–41)
Anion gap: 8 (ref 5–15)
BUN: 46 mg/dL — ABNORMAL HIGH (ref 6–20)
CALCIUM: 8.6 mg/dL — AB (ref 8.9–10.3)
CO2: 27 mmol/L (ref 22–32)
Chloride: 93 mmol/L — ABNORMAL LOW (ref 101–111)
Creatinine, Ser: 1.51 mg/dL — ABNORMAL HIGH (ref 0.61–1.24)
GFR calc Af Amer: 44 mL/min — ABNORMAL LOW (ref >60)
GFR calc non Af Amer: 38 mL/min — ABNORMAL LOW (ref >60)
Glucose, Bld: 185 mg/dL — ABNORMAL HIGH (ref 65–99)
Potassium: 5.5 mmol/L — ABNORMAL HIGH (ref 3.5–5.1)
Sodium: 128 mmol/L — ABNORMAL LOW (ref 135–145)
Total Bilirubin: 0.2 mg/dL — ABNORMAL LOW (ref 0.3–1.2)
Total Protein: 5.8 g/dL — ABNORMAL LOW (ref 6.5–8.1)

## 2014-11-04 LAB — BASIC METABOLIC PANEL
ANION GAP: 5 (ref 5–15)
BUN: 43 mg/dL — ABNORMAL HIGH (ref 6–20)
CO2: 27 mmol/L (ref 22–32)
CREATININE: 1.31 mg/dL — AB (ref 0.61–1.24)
Calcium: 8.7 mg/dL — ABNORMAL LOW (ref 8.9–10.3)
Chloride: 103 mmol/L (ref 101–111)
GFR calc Af Amer: 53 mL/min — ABNORMAL LOW (ref >60)
GFR calc non Af Amer: 46 mL/min — ABNORMAL LOW (ref >60)
GLUCOSE: 111 mg/dL — AB (ref 65–99)
Potassium: 5 mmol/L (ref 3.5–5.1)
SODIUM: 135 mmol/L (ref 135–145)

## 2014-11-06 LAB — URINALYSIS COMPLETE WITH MICROSCOPIC (ARMC ONLY)
Bilirubin Urine: NEGATIVE
Glucose, UA: >500 mg/dL — AB
Ketones, ur: NEGATIVE mg/dL
Nitrite: NEGATIVE
PROTEIN: 30 mg/dL — AB
Specific Gravity, Urine: 1.013 (ref 1.005–1.030)
Squamous Epithelial / LPF: NONE SEEN
pH: 5 (ref 5.0–8.0)

## 2014-11-11 LAB — BASIC METABOLIC PANEL
Anion gap: 7 (ref 5–15)
BUN: 38 mg/dL — AB (ref 6–20)
CALCIUM: 8.9 mg/dL (ref 8.9–10.3)
CHLORIDE: 96 mmol/L — AB (ref 101–111)
CO2: 27 mmol/L (ref 22–32)
CREATININE: 1.38 mg/dL — AB (ref 0.61–1.24)
GFR calc Af Amer: 50 mL/min — ABNORMAL LOW (ref >60)
GFR calc non Af Amer: 43 mL/min — ABNORMAL LOW (ref >60)
GLUCOSE: 170 mg/dL — AB (ref 65–99)
Potassium: 5.2 mmol/L — ABNORMAL HIGH (ref 3.5–5.1)
Sodium: 130 mmol/L — ABNORMAL LOW (ref 135–145)

## 2014-11-16 LAB — BASIC METABOLIC PANEL
Anion gap: 6 (ref 5–15)
BUN: 52 mg/dL — AB (ref 6–20)
CALCIUM: 9 mg/dL (ref 8.9–10.3)
CHLORIDE: 101 mmol/L (ref 101–111)
CO2: 27 mmol/L (ref 22–32)
CREATININE: 1.49 mg/dL — AB (ref 0.61–1.24)
GFR calc Af Amer: 45 mL/min — ABNORMAL LOW (ref >60)
GFR calc non Af Amer: 39 mL/min — ABNORMAL LOW (ref >60)
GLUCOSE: 114 mg/dL — AB (ref 65–99)
Potassium: 5.1 mmol/L (ref 3.5–5.1)
Sodium: 134 mmol/L — ABNORMAL LOW (ref 135–145)

## 2014-11-25 ENCOUNTER — Encounter: Admission: RE | Admit: 2014-11-25 | Payer: Medicare Other | Source: Ambulatory Visit | Admitting: Internal Medicine

## 2014-11-25 ENCOUNTER — Encounter: Payer: Self-pay | Admitting: *Deleted

## 2014-12-14 ENCOUNTER — Ambulatory Visit (INDEPENDENT_AMBULATORY_CARE_PROVIDER_SITE_OTHER): Payer: Medicare Other | Admitting: Urology

## 2014-12-14 ENCOUNTER — Encounter: Payer: Self-pay | Admitting: Urology

## 2014-12-14 DIAGNOSIS — N3000 Acute cystitis without hematuria: Secondary | ICD-10-CM

## 2014-12-14 DIAGNOSIS — R339 Retention of urine, unspecified: Secondary | ICD-10-CM

## 2014-12-14 NOTE — Progress Notes (Signed)
12/14/2014 12:06 PM   [name] [birth year] [id number]  Referring [name]: James Ruths, MD [place], [place] [address]  Chief Complaint  Patient presents with  . Recurrent UTI    New Patient    HPI: The patient is a 81 year old gentleman with a past medical history of urinary retention status post suprapubic catheter placement presents for recurrent urinary tract infections. He recently had a Pseudomonas urinary tract infection. It was sensitive to ciprofloxacin. Prior to this he had a MRSA skin infection at the site of the catheter insertion. He been under the care of Dr. Gareth Montes at [facility]. He was getting his SP tube changed every 3 months in the IR suite. Outside of his MRSA infection and his 1 recent UTI, he has had no other systematic UTIs. He does know of vague pain in his lower abdomen. This occurs at the same time every day and only briefly. He cannot localize it GU his tube or his penis. His SP tube was last changed 2 weeks ago.   PMH: Past Medical History  Diagnosis Date  . Coronary artery disease   . Heart attack   . Diabetes mellitus type 2, uncomplicated   . Atrial fibrillation   . Skin cancer   . Chronic kidney disease   . CHF (congestive heart failure)   . Muscle weakness   . Bladder neck obstruction   . Atrial fibrillation   . Cardiomyopathy   . Depression   . Anxiety     Surgical History: Past Surgical History  Procedure Laterality Date  . Coronary artery bypass graft  1989  . Skin cancer excision    . Laminectomy    . Coronary angioplasty    . Cataract extraction Left   . Suprapubic catheter placement    . Appendectomy      Home Medications:    Medication List       This list is accurate as of: 12/14/14 12:06 PM.  Always use your most recent med list.               acetaminophen 325 MG tablet  Commonly known as:  TYLENOL  Take 650 mg by mouth every 4 (four) hours as needed for mild pain, fever or headache.       apixaban 2.5 MG Tabs tablet  Commonly known as:  ELIQUIS  Take 2.5 mg by mouth 2 (two) times daily.     feeding supplement (GLUCERNA SHAKE) Liqd  Take 237 mLs by mouth 3 (three) times daily between meals.     folic acid 528 MCG tablet  Commonly known as:  FOLVITE  Take 400 mcg by mouth daily.     insulin glargine 100 UNIT/ML injection  Commonly known as:  LANTUS  Inject 12 Units into the skin at bedtime.     loperamide 2 MG capsule  Commonly known as:  IMODIUM  Take 2-4 mg by mouth every 3 (three) hours as needed for diarrhea or loose stools.     LORazepam 0.5 MG tablet  Commonly known as:  ATIVAN  Take 0.5 mg by mouth every 4 (four) hours as needed for anxiety.     MAG-AL PLUS PO  Take by mouth.     meclizine 25 MG tablet  Commonly known as:  ANTIVERT  Take 25 mg by mouth 3 (three) times daily as needed for dizziness.     metoprolol succinate 50 MG 24 hr tablet  Commonly known as:  TOPROL-XL  Take 50 mg by mouth daily.     MILK OF MAGNESIA PO  Take by mouth.     naproxen 500 MG tablet  Commonly known as:  NAPROSYN  Take 500 mg by mouth 2 (two) times daily with a meal.     oxyCODONE 5 MG immediate release tablet  Commonly known as:  Oxy IR/ROXICODONE  Take 5 mg by mouth every 4 (four) hours as needed for severe pain.     phenazopyridine 200 MG tablet  Commonly known as:  PYRIDIUM  Take 1 tablet (200 mg total) by mouth 3 (three) times daily as needed for pain.     PRESERVISION AREDS 2 PO  Take 1 capsule by mouth 2 (two) times daily.     THEREMS-M PO  Take 1 tablet by mouth daily.     SENNA S 8.6-50 MG per tablet  Generic drug:  senna-docusate  Take 1 tablet by mouth 2 (two) times daily.     silver sulfADIAZINE 1 % cream  Commonly known as:  SILVADENE  Apply 1 application topically daily.     simvastatin 40 MG tablet  Commonly known as:  ZOCOR  Take 40 mg by mouth at bedtime.     sitaGLIPtin 50 MG tablet  Commonly known as:  JANUVIA  Take 50 mg by  mouth daily.     torsemide 5 MG tablet  Commonly known as:  DEMADEX  Take 5 mg by mouth daily.     traMADol 50 MG tablet  Commonly known as:  ULTRAM  Take 50 mg by mouth every 6 (six) hours as needed for moderate pain.     traZODone 100 MG tablet  Commonly known as:  DESYREL  Take 100 mg by mouth at bedtime.     venlafaxine XR 37.5 MG 24 hr capsule  Commonly known as:  EFFEXOR-XR     vitamin B-12 1000 MCG tablet  Commonly known as:  CYANOCOBALAMIN  Take 1,000 mcg by mouth daily.     Vitamin D3 2000 UNITS capsule  Take 2,000 Units by mouth daily.        Allergies:  Allergies  Allergen Reactions  . Ambien [Zolpidem Tartrate] Other (See Comments)    Reaction:  Altered mental status   . Morphine And Related Other (See Comments)    Reaction:  Altered mental status   . Penicillins Other (See Comments)    Reaction:  Unknown     Family History: Family History  Problem Relation Age of Onset  . Diabetes Mellitus II Mother   . Diabetes Mellitus II Brother   . CAD Brother     Social History:  reports that he has never smoked. He has never used smokeless tobacco. He reports that he does not drink alcohol or use illicit drugs.  ROS: UROLOGY Frequent Urination?: No Hard to postpone urination?: No Burning/pain with urination?: Yes Get up at night to urinate?: No Leakage of urine?: No Urine stream starts and stops?: No Trouble starting stream?: No Do you have to strain to urinate?: No Blood in urine?: No Urinary tract infection?: Yes Sexually transmitted disease?: No Injury to kidneys or bladder?: No Painful intercourse?: No Weak stream?: No Erection problems?: No Penile pain?: No  Gastrointestinal Nausea?: No Vomiting?: No Indigestion/heartburn?: No Diarrhea?: No Constipation?: No  Constitutional Fever: No Night sweats?: No Weight loss?: No Fatigue?: No  Skin Skin rash/lesions?: No Itching?: No  Eyes Blurred vision?: No Double vision?:  No  Ears/Nose/Throat Sore throat?: No Sinus problems?: No  Hematologic/Lymphatic Swollen glands?: No Easy bruising?: No  Cardiovascular Leg swelling?: No Chest pain?: No  Respiratory Cough?: No Shortness of breath?: No  Endocrine Excessive thirst?: No  Musculoskeletal Back pain?: No Joint pain?: No  Neurological Headaches?: No Dizziness?: No  Psychologic Depression?: No Anxiety?: No  Physical Exam: There were no vitals taken for this visit.  Constitutional:  Alert and oriented, No acute distress. HEENT: [place] AT, moist mucus membranes.  Trachea midline, no masses. Cardiovascular: No clubbing, cyanosis, or edema. Respiratory: Normal respiratory effort, no increased work of breathing. GI: Abdomen is soft, nontender, nondistended, no abdominal masses GU: No CVA tenderness. Suprapubic tube in place. [place] Clear urine. Cystotomy site noninfected. Normal phallus testicles descended equally bilaterally. Skin: No rashes, bruises or suspicious lesions. Lymph: No cervical or inguinal adenopathy. Neurologic: Grossly intact, no focal deficits, moving all 4 extremities. Psychiatric: Normal mood and affect.  Laboratory Data: Lab Results  Component Value Date   WBC 8.2 11/02/2014   HGB 12.0* 11/02/2014   HCT 37.4* 11/02/2014   MCV 87.5 11/02/2014   PLT 166 11/02/2014    Lab Results  Component Value Date   CREATININE 1.49* 11/16/2014    No results found for: PSA  No results found for: TESTOSTERONE  Lab Results  Component Value Date   HGBA1C 7.2* 10/20/2014    Urinalysis    Component Value Date/Time   COLORURINE YELLOW* 11/06/2014 2130   COLORURINE Yellow 07/17/2014 1352   APPEARANCEUR CLOUDY* 11/06/2014 2130   APPEARANCEUR Cloudy 07/17/2014 1352   LABSPEC 1.013 11/06/2014 2130   LABSPEC 1.016 07/17/2014 1352   PHURINE 5.0 11/06/2014 2130   PHURINE 6.0 07/17/2014 1352   GLUCOSEU >500* 11/06/2014 2130   GLUCOSEU >=500 07/17/2014 1352   HGBUR 2+*  11/06/2014 2130   HGBUR 3+ 07/17/2014 1352   BILIRUBINUR NEGATIVE 11/06/2014 2130   BILIRUBINUR Negative 07/17/2014 [place] 11/06/2014 2130   KETONESUR Negative 07/17/2014 1352   PROTEINUR 30* 11/06/2014 2130   PROTEINUR 100 mg/dL 07/17/2014 1352   NITRITE NEGATIVE 11/06/2014 2130   NITRITE Negative 07/17/2014 1352   LEUKOCYTESUR 3+* 11/06/2014 2130   LEUKOCYTESUR 3+ 07/17/2014 1352     Assessment & Plan:    1. Urinary retention The patient has a suprapubic tube in place. His facility will not change it. He is due for an exchange in 2 weeks. He will follow-up in the office for an exchange in 2 weeks. He will then follow-up monthly in the office for exchange thereafter.  2. Urinary tract infection The patient had one systematic UTI. He also had an infection at his cystotomy site. Discussed the patient that he will always likely have positive urinary cultures. He is instructed to not have them treated unless he is symptomatic which could include weakness, change in mental status, dysuria among other symptoms. He is agreeable to this treatment plan. His family and him are aware to contact me if he is diagnosed with a symptomatic urinary tract infection.   Nickie Retort, MD  [facility] [address] [place],  [address] [phone]

## 2014-12-25 ENCOUNTER — Encounter
Admission: RE | Admit: 2014-12-25 | Discharge: 2014-12-25 | Disposition: A | Discharge status: Home / Self Care | Payer: Medicare Other | Source: Ambulatory Visit | Attending: Internal Medicine | Admitting: Internal Medicine

## 2014-12-25 DIAGNOSIS — E119 Type 2 diabetes mellitus without complications: Secondary | ICD-10-CM | POA: Insufficient documentation

## 2014-12-25 DIAGNOSIS — R339 Retention of urine, unspecified: Secondary | ICD-10-CM | POA: Insufficient documentation

## 2014-12-25 DIAGNOSIS — N182 Chronic kidney disease, stage 2 (mild): Secondary | ICD-10-CM | POA: Insufficient documentation

## 2014-12-28 ENCOUNTER — Ambulatory Visit (INDEPENDENT_AMBULATORY_CARE_PROVIDER_SITE_OTHER): Payer: Medicare Other

## 2014-12-28 DIAGNOSIS — R339 Retention of urine, unspecified: Secondary | ICD-10-CM | POA: Diagnosis not present

## 2014-12-28 NOTE — Progress Notes (Signed)
Suprapubic Cath Change  Patient is present today for a suprapubic catheter change due to urinary retention.  There was difficulty in draining the balloon it would not drain with a 10cc syringe per Dr. Tresa Moore the cath was cut above the drain lock and the water then flowed out and 29ml of water was drained from the balloon, a 16FR foley cath was removed from the tract, a gentle tug was given to make sure balloon was empty of water and the cath came out with out difficulty.  Site was cleaned and prepped in a sterile fashion with betadine.  A 16FR foley cath was replaced into the tract no complications were noted. Urine return was noted, 10 ml of sterile water was inflated into the balloon and the cath was attached to patient's current leg bag for drainage, patient did not want the bag we had in supply and will get a replacement bag at the facility.  Patient tolerated well. A night bag was given to patient and proper instruction was given on how to switch bags.    Preformed by: Fonnie Jarvis, CMA  Follow up: 69month for repeat cath change

## 2014-12-29 DIAGNOSIS — N189 Chronic kidney disease, unspecified: Secondary | ICD-10-CM

## 2014-12-29 DIAGNOSIS — I2510 Atherosclerotic heart disease of native coronary artery without angina pectoris: Secondary | ICD-10-CM

## 2014-12-29 DIAGNOSIS — Z515 Encounter for palliative care: Secondary | ICD-10-CM

## 2014-12-29 DIAGNOSIS — Z66 Do not resuscitate: Secondary | ICD-10-CM

## 2014-12-29 DIAGNOSIS — E1122 Type 2 diabetes mellitus with diabetic chronic kidney disease: Secondary | ICD-10-CM

## 2014-12-29 DIAGNOSIS — I509 Heart failure, unspecified: Secondary | ICD-10-CM

## 2014-12-29 DIAGNOSIS — I4891 Unspecified atrial fibrillation: Secondary | ICD-10-CM

## 2015-01-03 LAB — GLUCOSE, CAPILLARY: GLUCOSE-CAPILLARY: 191 mg/dL — AB (ref 65–99)

## 2015-01-04 LAB — GLUCOSE, CAPILLARY
GLUCOSE-CAPILLARY: 90 mg/dL (ref 65–99)
Glucose-Capillary: 237 mg/dL — ABNORMAL HIGH (ref 65–99)

## 2015-01-05 LAB — GLUCOSE, CAPILLARY
Glucose-Capillary: 98 mg/dL (ref 65–99)
Glucose-Capillary: 212 mg/dL — ABNORMAL HIGH (ref 65–99)

## 2015-01-06 LAB — GLUCOSE, CAPILLARY: Glucose-Capillary: 222 mg/dL — ABNORMAL HIGH (ref 65–99)

## 2015-01-07 LAB — GLUCOSE, CAPILLARY
GLUCOSE-CAPILLARY: 125 mg/dL — AB (ref 65–99)
GLUCOSE-CAPILLARY: 244 mg/dL — AB (ref 65–99)

## 2015-01-08 LAB — GLUCOSE, CAPILLARY
GLUCOSE-CAPILLARY: 143 mg/dL — AB (ref 65–99)
GLUCOSE-CAPILLARY: 209 mg/dL — AB (ref 65–99)

## 2015-01-09 LAB — GLUCOSE, CAPILLARY
GLUCOSE-CAPILLARY: 142 mg/dL — AB (ref 65–99)
GLUCOSE-CAPILLARY: 254 mg/dL — AB (ref 65–99)

## 2015-01-10 LAB — GLUCOSE, CAPILLARY
GLUCOSE-CAPILLARY: 212 mg/dL — AB (ref 65–99)
Glucose-Capillary: 114 mg/dL — ABNORMAL HIGH (ref 65–99)
Glucose-Capillary: 142 mg/dL — ABNORMAL HIGH (ref 65–99)

## 2015-01-11 LAB — GLUCOSE, CAPILLARY
GLUCOSE-CAPILLARY: 208 mg/dL — AB (ref 65–99)
Glucose-Capillary: 111 mg/dL — ABNORMAL HIGH (ref 65–99)

## 2015-01-12 LAB — GLUCOSE, CAPILLARY
GLUCOSE-CAPILLARY: 179 mg/dL — AB (ref 65–99)
GLUCOSE-CAPILLARY: 95 mg/dL (ref 65–99)
Glucose-Capillary: 291 mg/dL — ABNORMAL HIGH (ref 65–99)

## 2015-01-13 LAB — GLUCOSE, CAPILLARY
GLUCOSE-CAPILLARY: 109 mg/dL — AB (ref 65–99)
GLUCOSE-CAPILLARY: 215 mg/dL — AB (ref 65–99)

## 2015-01-14 LAB — GLUCOSE, CAPILLARY: GLUCOSE-CAPILLARY: 99 mg/dL (ref 65–99)

## 2015-01-17 LAB — GLUCOSE, CAPILLARY: Glucose-Capillary: 231 mg/dL — ABNORMAL HIGH (ref 65–99)

## 2015-01-18 LAB — GLUCOSE, CAPILLARY
GLUCOSE-CAPILLARY: 199 mg/dL — AB (ref 65–99)
Glucose-Capillary: 113 mg/dL — ABNORMAL HIGH (ref 65–99)

## 2015-01-19 LAB — GLUCOSE, CAPILLARY: GLUCOSE-CAPILLARY: 107 mg/dL — AB (ref 65–99)

## 2015-01-21 LAB — GLUCOSE, CAPILLARY: Glucose-Capillary: 229 mg/dL — ABNORMAL HIGH (ref 65–99)

## 2015-01-22 LAB — GLUCOSE, CAPILLARY
GLUCOSE-CAPILLARY: 140 mg/dL — AB (ref 65–99)
GLUCOSE-CAPILLARY: 203 mg/dL — AB (ref 65–99)

## 2015-01-23 LAB — GLUCOSE, CAPILLARY
GLUCOSE-CAPILLARY: 130 mg/dL — AB (ref 65–99)
Glucose-Capillary: 223 mg/dL — ABNORMAL HIGH (ref 65–99)

## 2015-01-24 DIAGNOSIS — E119 Type 2 diabetes mellitus without complications: Secondary | ICD-10-CM | POA: Diagnosis present

## 2015-01-24 DIAGNOSIS — R339 Retention of urine, unspecified: Secondary | ICD-10-CM | POA: Diagnosis present

## 2015-01-24 DIAGNOSIS — N182 Chronic kidney disease, stage 2 (mild): Secondary | ICD-10-CM | POA: Diagnosis not present

## 2015-01-24 LAB — GLUCOSE, CAPILLARY: Glucose-Capillary: 104 mg/dL — ABNORMAL HIGH (ref 65–99)

## 2015-01-25 ENCOUNTER — Encounter
Admission: RE | Admit: 2015-01-25 | Discharge: 2015-01-25 | Disposition: A | Discharge status: Home / Self Care | Payer: Medicare Other | Source: Ambulatory Visit | Attending: Internal Medicine | Admitting: Internal Medicine

## 2015-01-25 DIAGNOSIS — R05 Cough: Secondary | ICD-10-CM | POA: Insufficient documentation

## 2015-01-25 DIAGNOSIS — R5381 Other malaise: Secondary | ICD-10-CM | POA: Insufficient documentation

## 2015-01-25 DIAGNOSIS — N183 Chronic kidney disease, stage 3 (moderate): Secondary | ICD-10-CM | POA: Insufficient documentation

## 2015-01-25 LAB — URINALYSIS COMPLETE WITH MICROSCOPIC (ARMC ONLY)
Bilirubin Urine: NEGATIVE
HGB URINE DIPSTICK: NEGATIVE
Ketones, ur: NEGATIVE mg/dL
Nitrite: POSITIVE — AB
Protein, ur: 30 mg/dL — AB
Specific Gravity, Urine: 1.01 (ref 1.005–1.030)
Squamous Epithelial / LPF: NONE SEEN
pH: 5 (ref 5.0–8.0)

## 2015-01-26 LAB — GLUCOSE, CAPILLARY: GLUCOSE-CAPILLARY: 253 mg/dL — AB (ref 65–99)

## 2015-01-27 LAB — GLUCOSE, CAPILLARY
GLUCOSE-CAPILLARY: 252 mg/dL — AB (ref 65–99)
Glucose-Capillary: 126 mg/dL — ABNORMAL HIGH (ref 65–99)

## 2015-01-28 LAB — GLUCOSE, CAPILLARY: Glucose-Capillary: 117 mg/dL — ABNORMAL HIGH (ref 65–99)

## 2015-01-29 LAB — URINE CULTURE

## 2015-01-31 ENCOUNTER — Ambulatory Visit: Payer: Medicare Other | Admitting: Urology

## 2015-01-31 LAB — GLUCOSE, CAPILLARY: Glucose-Capillary: 325 mg/dL — ABNORMAL HIGH (ref 65–99)

## 2015-02-01 LAB — GLUCOSE, CAPILLARY
GLUCOSE-CAPILLARY: 243 mg/dL — AB (ref 65–99)
Glucose-Capillary: 144 mg/dL — ABNORMAL HIGH (ref 65–99)

## 2015-02-02 ENCOUNTER — Encounter: Payer: Self-pay | Admitting: Urology

## 2015-02-02 ENCOUNTER — Ambulatory Visit (INDEPENDENT_AMBULATORY_CARE_PROVIDER_SITE_OTHER): Payer: Medicare Other | Admitting: Urology

## 2015-02-02 VITALS — BP 126/76 | HR 97 | Ht 72.0 in | Wt 196.5 lb

## 2015-02-02 DIAGNOSIS — N390 Urinary tract infection, site not specified: Secondary | ICD-10-CM

## 2015-02-02 DIAGNOSIS — R339 Retention of urine, unspecified: Secondary | ICD-10-CM | POA: Insufficient documentation

## 2015-02-02 LAB — GLUCOSE, CAPILLARY: Glucose-Capillary: 128 mg/dL — ABNORMAL HIGH (ref 65–99)

## 2015-02-02 NOTE — Progress Notes (Signed)
02/02/2015 11:00 AM   [name] 1921-03-12 [id number]  Referring provider: [name], MD [facility] [place], [place] [address]  Chief Complaint  Patient presents with  . Urinary Retention    SP Tube change    HPI: Patient is a 81 year old white male who present today for a SPT exchange.  Patient has been complaining of bladder spasms and leaking around the insertion site.  He states the staff has to flush the tube every night.  He also states he has noticed a diminished output in the evenings.  I do not have any orders for the patient with me at today's visit.  I do not know if the catheter flushing is a prn event or if it is scheduled.  I also do not know if his output is being recorded.    The guaze at the insertion site is soaked with urine.   PMH: Past Medical History  Diagnosis Date  . Coronary artery disease   . Heart attack ([place])   . Diabetes mellitus type 2, uncomplicated ([place])   . Atrial fibrillation ([place])   . Skin cancer   . Chronic kidney disease   . CHF (congestive heart failure) ([place])   . Muscle weakness   . Bladder neck obstruction   . Atrial fibrillation ([place])   . Cardiomyopathy ([place])   . Depression   . Anxiety     Surgical History: Past Surgical History  Procedure Laterality Date  . Coronary artery bypass graft  1989  . Skin cancer excision    . Laminectomy    . Coronary angioplasty    . Cataract extraction Left   . Suprapubic catheter placement    . Appendectomy      Home Medications:    Medication List       This list is accurate as of: 02/02/15 11:00 AM.  Always use your most recent med list.               acetaminophen 325 MG tablet  Commonly known as:  TYLENOL  Take 650 mg by mouth every 4 (four) hours as needed for mild pain, fever or headache.     apixaban 2.5 MG Tabs tablet  Commonly known as:  ELIQUIS  Take 2.5 mg by mouth 2 (two) times daily.     ciprofloxacin 250 MG  tablet  Commonly known as:  CIPRO     feeding supplement (GLUCERNA SHAKE) Liqd  Take 237 mLs by mouth 3 (three) times daily between meals.     folic acid 397 MCG tablet  Commonly known as:  FOLVITE  Take 400 mcg by mouth daily.     insulin glargine 100 UNIT/ML injection  Commonly known as:  LANTUS  Inject 12 Units into the skin at bedtime.     loperamide 2 MG capsule  Commonly known as:  IMODIUM  Take 2-4 mg by mouth every 3 (three) hours as needed for diarrhea or loose stools.     LORazepam 0.5 MG tablet  Commonly known as:  ATIVAN  Take 0.5 mg by mouth every 4 (four) hours as needed for anxiety.     MAG-AL PLUS PO  Take by mouth.     meclizine 25 MG tablet  Commonly known as:  ANTIVERT  Take 25 mg by mouth 3 (three) times daily as needed for dizziness.     metoprolol succinate 100 MG 24 hr tablet  Commonly known as:  TOPROL-XL     MILK OF MAGNESIA PO  Take by mouth.     MONUROL 3 G Pack  Generic drug:  fosfomycin     naproxen 500 MG tablet  Commonly known as:  NAPROSYN  Take 500 mg by mouth 2 (two) times daily with a meal.     oxyCODONE 5 MG immediate release tablet  Commonly known as:  Oxy IR/ROXICODONE  Take 5 mg by mouth every 4 (four) hours as needed for severe pain.     phenazopyridine 200 MG tablet  Commonly known as:  PYRIDIUM  Take 1 tablet (200 mg total) by mouth 3 (three) times daily as needed for pain.     PRESERVISION AREDS 2 PO  Take 1 capsule by mouth 2 (two) times daily.     THEREMS-M PO  Take 1 tablet by mouth daily.     SENNA S 8.6-50 MG tablet  Generic drug:  senna-docusate  Take 1 tablet by mouth 2 (two) times daily.     silver sulfADIAZINE 1 % cream  Commonly known as:  SILVADENE  Apply 1 application topically daily.     simvastatin 40 MG tablet  Commonly known as:  ZOCOR  Take 40 mg by mouth at bedtime.     sitaGLIPtin 50 MG tablet  Commonly known as:  JANUVIA  Take 50 mg by mouth daily.     torsemide 5 MG tablet    Commonly known as:  DEMADEX  Take 5 mg by mouth daily.     traMADol 50 MG tablet  Commonly known as:  ULTRAM  Take 50 mg by mouth every 6 (six) hours as needed for moderate pain.     traZODone 100 MG tablet  Commonly known as:  DESYREL  Take 100 mg by mouth at bedtime.     venlafaxine XR 37.5 MG 24 hr capsule  Commonly known as:  EFFEXOR-XR     vitamin B-12 1000 MCG tablet  Commonly known as:  CYANOCOBALAMIN  Take 1,000 mcg by mouth daily.     Vitamin D3 2000 UNITS capsule  Take 2,000 Units by mouth daily.        Allergies:  Allergies  Allergen Reactions  . Ambien [Zolpidem Tartrate] Other (See Comments)    Reaction:  Altered mental status   . Morphine And Related Other (See Comments)    Reaction:  Altered mental status   . Penicillins Other (See Comments)    Reaction:  Unknown     Family History: Family History  Problem Relation Age of Onset  . Diabetes Mellitus II Mother   . Diabetes Mellitus II Brother   . CAD Brother     Social History:  reports that he has never smoked. He has never used smokeless tobacco. He reports that he does not drink alcohol or use illicit drugs.  ROS: UROLOGY Frequent Urination?: No Hard to postpone urination?: No Burning/pain with urination?: Yes Get up at night to urinate?: No Leakage of urine?: No Urine stream starts and stops?: No Trouble starting stream?: No Do you have to strain to urinate?: No Blood in urine?: No Urinary tract infection?: No Sexually transmitted disease?: No Injury to kidneys or bladder?: No Painful intercourse?: No Weak stream?: No Erection problems?: No Penile pain?: No  Gastrointestinal Nausea?: No Vomiting?: No Indigestion/heartburn?: No Diarrhea?: No Constipation?: No  Constitutional Fever: No Night sweats?: No Weight loss?: No Fatigue?: No  Skin Skin rash/lesions?: No Itching?: No  Eyes Blurred vision?: No Double vision?: No  Ears/Nose/Throat Sore throat?: No Sinus  problems?: No  Hematologic/Lymphatic Swollen glands?: No Easy bruising?: No  Cardiovascular Leg swelling?: No Chest pain?: No  Respiratory Cough?: No Shortness of breath?: No  Endocrine Excessive thirst?: No  Musculoskeletal Back pain?: No Joint pain?: No  Neurological Headaches?: No Dizziness?: No  Psychologic Depression?: No Anxiety?: No  Physical Exam: BP 126/76 mmHg  Pulse 97  Ht 6' (1.829 m)  Wt 196 lb 8 oz (89.132 kg)  BMI 26.64 kg/m2  Constitutional: Well nourished. Alert and oriented, No acute distress. HEENT: [place] AT, moist mucus membranes. Trachea midline, no masses. Cardiovascular: No clubbing, cyanosis, or edema. Respiratory: Normal respiratory effort, no increased work of breathing. GI: Abdomen is soft, non tender, non distended, no abdominal masses. Liver and spleen not palpable.  No hernias appreciated.  Stool sample for occult testing is not indicated.  SPT site is clean, but the guaze is soaked with urine. Skin: No rashes, bruises or suspicious lesions. Lymph: No cervical or inguinal adenopathy. Neurologic: Grossly intact, no focal deficits, moving all 4 extremities. Psychiatric: Normal mood and affect.  Laboratory Data: Lab Results  Component Value Date   WBC 8.2 11/02/2014   HGB 12.0* 11/02/2014   HCT 37.4* 11/02/2014   MCV 87.5 11/02/2014   PLT 166 11/02/2014    Lab Results  Component Value Date   CREATININE 1.49* 11/16/2014   Lab Results  Component Value Date   HGBA1C 7.2* 10/20/2014   Procedure note: Suprapubic Cath Change  Patient is present today for a suprapubic catheter change due to urinary retention.  10 ml of water was drained from the balloon, a 16 FR foley cath was removed from the tract with out difficulty.  Site was cleaned and prepped in a sterile fashion with betadine.  A 16 FR foley cath was replaced into the tract no complications were noted. Urine return was noted, 10 ml of sterile water was inflated into the  balloon and a overnight bag was attached for drainage.  Patient tolerated well.   Patient's son has noted skin breakdown around his shin where the leg bag is strapped.  I did not notice it on today's exam.  I suggested to have the overnight bag attached when the patient is not leaving the facility.  I also advised to have his leg strap up higher on his thigh to prevent tension on the tube that would cause erosion of the insertion site.   Preformed by: Zara Council, PA-C  Follow up: One month  Assessment & Plan:    1.  Urinary retention:   His facility will not change the SPT.  He will then follow-up monthly in the office for exchange.  I will recommend tube flushes every 6 hours to prevent sediment from clogging the tube.  I will also recommend recording his output and to bring those results with him to his next visit.  If his output is less than 50 cc an hour, please contact noted his PCP's physician.     2.   Urinary tract infection:   The patient had one systematic UTI. He also had an infection at his cystotomy site.  Dr. Pilar Jarvis discussed with the patient at his last visit  that he will always likely have positive urinary cultures. He is instructed to not have them treated unless he is symptomatic which could include weakness, change in mental status, dysuria among other symptoms. He is agreeable to this treatment plan. His family and him are aware to contact me  if he is diagnosed with a symptomatic urinary tract infection.   The patient and his son advised me he is in isolation due to an E. Coli infection, but I do not have any records concerning this at this time.     Return in about 1 month (around 03/04/2015) for SPT exchange.  Zara Council, [facility] [address], [place], [place] [address] [phone]

## 2015-02-04 ENCOUNTER — Ambulatory Visit: Payer: Medicare Other | Admitting: Urology

## 2015-02-04 LAB — GLUCOSE, CAPILLARY: GLUCOSE-CAPILLARY: 294 mg/dL — AB (ref 65–99)

## 2015-02-05 LAB — GLUCOSE, CAPILLARY
GLUCOSE-CAPILLARY: 119 mg/dL — AB (ref 65–99)
GLUCOSE-CAPILLARY: 244 mg/dL — AB (ref 65–99)

## 2015-02-06 LAB — GLUCOSE, CAPILLARY
GLUCOSE-CAPILLARY: 144 mg/dL — AB (ref 65–99)
Glucose-Capillary: 206 mg/dL — ABNORMAL HIGH (ref 65–99)

## 2015-02-07 LAB — GLUCOSE, CAPILLARY: Glucose-Capillary: 118 mg/dL — ABNORMAL HIGH (ref 65–99)

## 2015-02-09 ENCOUNTER — Encounter: Payer: Self-pay | Admitting: Emergency Medicine

## 2015-02-09 ENCOUNTER — Observation Stay
Admission: EM | Admit: 2015-02-09 | Discharge: 2015-02-12 | Disposition: A | Discharge status: Skilled Nursing Facility | Payer: Medicare Other | Attending: Internal Medicine | Admitting: Internal Medicine

## 2015-02-09 DIAGNOSIS — I482 Chronic atrial fibrillation: Secondary | ICD-10-CM | POA: Insufficient documentation

## 2015-02-09 DIAGNOSIS — L89321 Pressure ulcer of left buttock, stage 1: Secondary | ICD-10-CM | POA: Insufficient documentation

## 2015-02-09 DIAGNOSIS — F329 Major depressive disorder, single episode, unspecified: Secondary | ICD-10-CM | POA: Diagnosis not present

## 2015-02-09 DIAGNOSIS — Z880 Allergy status to penicillin: Secondary | ICD-10-CM | POA: Insufficient documentation

## 2015-02-09 DIAGNOSIS — I2510 Atherosclerotic heart disease of native coronary artery without angina pectoris: Secondary | ICD-10-CM | POA: Insufficient documentation

## 2015-02-09 DIAGNOSIS — Z85828 Personal history of other malignant neoplasm of skin: Secondary | ICD-10-CM | POA: Insufficient documentation

## 2015-02-09 DIAGNOSIS — E1122 Type 2 diabetes mellitus with diabetic chronic kidney disease: Secondary | ICD-10-CM | POA: Diagnosis not present

## 2015-02-09 DIAGNOSIS — R918 Other nonspecific abnormal finding of lung field: Secondary | ICD-10-CM | POA: Insufficient documentation

## 2015-02-09 DIAGNOSIS — E875 Hyperkalemia: Secondary | ICD-10-CM | POA: Diagnosis not present

## 2015-02-09 DIAGNOSIS — R339 Retention of urine, unspecified: Secondary | ICD-10-CM | POA: Insufficient documentation

## 2015-02-09 DIAGNOSIS — Z66 Do not resuscitate: Secondary | ICD-10-CM | POA: Diagnosis not present

## 2015-02-09 DIAGNOSIS — Z794 Long term (current) use of insulin: Secondary | ICD-10-CM | POA: Insufficient documentation

## 2015-02-09 DIAGNOSIS — N170 Acute kidney failure with tubular necrosis: Secondary | ICD-10-CM | POA: Insufficient documentation

## 2015-02-09 DIAGNOSIS — Z7901 Long term (current) use of anticoagulants: Secondary | ICD-10-CM | POA: Diagnosis not present

## 2015-02-09 DIAGNOSIS — R109 Unspecified abdominal pain: Secondary | ICD-10-CM | POA: Diagnosis not present

## 2015-02-09 DIAGNOSIS — Z8249 Family history of ischemic heart disease and other diseases of the circulatory system: Secondary | ICD-10-CM | POA: Diagnosis not present

## 2015-02-09 DIAGNOSIS — I252 Old myocardial infarction: Secondary | ICD-10-CM | POA: Diagnosis not present

## 2015-02-09 DIAGNOSIS — N390 Urinary tract infection, site not specified: Secondary | ICD-10-CM | POA: Diagnosis present

## 2015-02-09 DIAGNOSIS — Z833 Family history of diabetes mellitus: Secondary | ICD-10-CM | POA: Diagnosis not present

## 2015-02-09 DIAGNOSIS — R0602 Shortness of breath: Secondary | ICD-10-CM | POA: Insufficient documentation

## 2015-02-09 DIAGNOSIS — R531 Weakness: Secondary | ICD-10-CM | POA: Insufficient documentation

## 2015-02-09 DIAGNOSIS — I429 Cardiomyopathy, unspecified: Secondary | ICD-10-CM | POA: Diagnosis not present

## 2015-02-09 DIAGNOSIS — I509 Heart failure, unspecified: Secondary | ICD-10-CM | POA: Insufficient documentation

## 2015-02-09 DIAGNOSIS — L89311 Pressure ulcer of right buttock, stage 1: Secondary | ICD-10-CM | POA: Insufficient documentation

## 2015-02-09 DIAGNOSIS — N3080 Other cystitis without hematuria: Secondary | ICD-10-CM | POA: Insufficient documentation

## 2015-02-09 DIAGNOSIS — K5900 Constipation, unspecified: Secondary | ICD-10-CM | POA: Diagnosis not present

## 2015-02-09 DIAGNOSIS — L8990 Pressure ulcer of unspecified site, unspecified stage: Secondary | ICD-10-CM | POA: Diagnosis present

## 2015-02-09 DIAGNOSIS — Z8614 Personal history of Methicillin resistant Staphylococcus aureus infection: Secondary | ICD-10-CM | POA: Diagnosis not present

## 2015-02-09 DIAGNOSIS — I130 Hypertensive heart and chronic kidney disease with heart failure and stage 1 through stage 4 chronic kidney disease, or unspecified chronic kidney disease: Secondary | ICD-10-CM | POA: Insufficient documentation

## 2015-02-09 DIAGNOSIS — F419 Anxiety disorder, unspecified: Secondary | ICD-10-CM | POA: Diagnosis not present

## 2015-02-09 DIAGNOSIS — Z951 Presence of aortocoronary bypass graft: Secondary | ICD-10-CM | POA: Diagnosis not present

## 2015-02-09 DIAGNOSIS — A499 Bacterial infection, unspecified: Secondary | ICD-10-CM | POA: Diagnosis present

## 2015-02-09 DIAGNOSIS — Z1612 Extended spectrum beta lactamase (ESBL) resistance: Secondary | ICD-10-CM | POA: Insufficient documentation

## 2015-02-09 DIAGNOSIS — Z888 Allergy status to other drugs, medicaments and biological substances status: Secondary | ICD-10-CM | POA: Diagnosis not present

## 2015-02-09 DIAGNOSIS — N183 Chronic kidney disease, stage 3 (moderate): Secondary | ICD-10-CM | POA: Insufficient documentation

## 2015-02-09 LAB — GLUCOSE, CAPILLARY: GLUCOSE-CAPILLARY: 242 mg/dL — AB (ref 65–99)

## 2015-02-09 NOTE — ED Notes (Addendum)
Pt from [facility] via EMS for suprapubic cath pain. 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 Pt Has redness and pus pocket like around catheter. Pt complaining of intermittent pain in lower abdominal pain. Pt comes with DNR papers.

## 2015-02-10 DIAGNOSIS — N3080 Other cystitis without hematuria: Secondary | ICD-10-CM

## 2015-02-10 DIAGNOSIS — N390 Urinary tract infection, site not specified: Secondary | ICD-10-CM | POA: Diagnosis not present

## 2015-02-10 DIAGNOSIS — Z1612 Extended spectrum beta lactamase (ESBL) resistance: Secondary | ICD-10-CM | POA: Diagnosis not present

## 2015-02-10 DIAGNOSIS — R338 Other retention of urine: Secondary | ICD-10-CM | POA: Diagnosis not present

## 2015-02-10 LAB — CBC
HCT: 36.8 % — ABNORMAL LOW (ref 40.0–52.0)
Hemoglobin: 11.8 g/dL — ABNORMAL LOW (ref 13.0–18.0)
MCH: 29 pg (ref 26.0–34.0)
MCHC: 31.9 g/dL — ABNORMAL LOW (ref 32.0–36.0)
MCV: 90.9 fL (ref 80.0–100.0)
PLATELETS: 157 10*3/uL (ref 150–440)
RBC: 4.05 MIL/uL — ABNORMAL LOW (ref 4.40–5.90)
RDW: 15.9 % — AB (ref 11.5–14.5)
WBC: 8.9 10*3/uL (ref 3.8–10.6)

## 2015-02-10 LAB — URINALYSIS COMPLETE WITH MICROSCOPIC (ARMC ONLY)
Bilirubin Urine: NEGATIVE
Glucose, UA: 50 mg/dL — AB
HGB URINE DIPSTICK: NEGATIVE
Ketones, ur: NEGATIVE mg/dL
Nitrite: POSITIVE — AB
PROTEIN: 100 mg/dL — AB
SPECIFIC GRAVITY, URINE: 1.01 (ref 1.005–1.030)
SQUAMOUS EPITHELIAL / LPF: NONE SEEN
pH: 6 (ref 5.0–8.0)

## 2015-02-10 LAB — COMPREHENSIVE METABOLIC PANEL
ALT: 20 U/L (ref 17–63)
AST: 19 U/L (ref 15–41)
Albumin: 3.1 g/dL — ABNORMAL LOW (ref 3.5–5.0)
Alkaline Phosphatase: 81 U/L (ref 38–126)
Anion gap: 7 (ref 5–15)
BILIRUBIN TOTAL: 0.8 mg/dL (ref 0.3–1.2)
BUN: 43 mg/dL — AB (ref 6–20)
CHLORIDE: 101 mmol/L (ref 101–111)
CO2: 25 mmol/L (ref 22–32)
Calcium: 9.3 mg/dL (ref 8.9–10.3)
Creatinine, Ser: 1.7 mg/dL — ABNORMAL HIGH (ref 0.61–1.24)
GFR, EST AFRICAN AMERICAN: 38 mL/min — AB (ref >60)
GFR, EST NON AFRICAN AMERICAN: 33 mL/min — AB (ref >60)
GLUCOSE: 151 mg/dL — AB (ref 65–99)
POTASSIUM: 5.3 mmol/L — AB (ref 3.5–5.1)
Sodium: 133 mmol/L — ABNORMAL LOW (ref 135–145)
Total Protein: 6.5 g/dL (ref 6.5–8.1)

## 2015-02-10 LAB — BASIC METABOLIC PANEL
ANION GAP: 5 (ref 5–15)
BUN: 43 mg/dL — ABNORMAL HIGH (ref 6–20)
CALCIUM: 9 mg/dL (ref 8.9–10.3)
CO2: 26 mmol/L (ref 22–32)
Chloride: 102 mmol/L (ref 101–111)
Creatinine, Ser: 1.74 mg/dL — ABNORMAL HIGH (ref 0.61–1.24)
GFR, EST AFRICAN AMERICAN: 37 mL/min — AB (ref >60)
GFR, EST NON AFRICAN AMERICAN: 32 mL/min — AB (ref >60)
Glucose, Bld: 195 mg/dL — ABNORMAL HIGH (ref 65–99)
POTASSIUM: 5.4 mmol/L — AB (ref 3.5–5.1)
Sodium: 133 mmol/L — ABNORMAL LOW (ref 135–145)

## 2015-02-10 LAB — GLUCOSE, CAPILLARY
Glucose-Capillary: 112 mg/dL — ABNORMAL HIGH (ref 65–99)
Glucose-Capillary: 117 mg/dL — ABNORMAL HIGH (ref 65–99)
Glucose-Capillary: 144 mg/dL — ABNORMAL HIGH (ref 65–99)
Glucose-Capillary: 157 mg/dL — ABNORMAL HIGH (ref 65–99)
Glucose-Capillary: 163 mg/dL — ABNORMAL HIGH (ref 65–99)
Glucose-Capillary: 211 mg/dL — ABNORMAL HIGH (ref 65–99)

## 2015-02-10 LAB — HEMOGLOBIN A1C: Hgb A1c MFr Bld: 7.7 % — ABNORMAL HIGH (ref 4.0–6.0)

## 2015-02-10 MED ORDER — VITAMIN B-12 1000 MCG PO TABS
1000.0000 ug | ORAL_TABLET | Freq: Every day | ORAL | Status: DC
  Administered 2015-02-10 – 2015-02-12 (×3): 1000 ug via ORAL
  Filled 2015-02-10 (×3): qty 1

## 2015-02-10 MED ORDER — GLUCERNA SHAKE PO LIQD
237.0000 mL | Freq: Three times a day (TID) | ORAL | Status: DC
  Administered 2015-02-10 – 2015-02-11 (×4): 237 mL via ORAL

## 2015-02-10 MED ORDER — DEXTROSE 5 % IV SOLN: 1.0000 g | Freq: Once | INTRAVENOUS | Status: DC

## 2015-02-10 MED ORDER — LOPERAMIDE HCL 2 MG PO CAPS: 2.0000 mg | ORAL_CAPSULE | ORAL | Status: DC | PRN

## 2015-02-10 MED ORDER — VENLAFAXINE HCL ER 37.5 MG PO CP24
37.5000 mg | ORAL_CAPSULE | Freq: Every day | ORAL | Status: DC
  Administered 2015-02-10 – 2015-02-12 (×3): 37.5 mg via ORAL
  Filled 2015-02-10 (×3): qty 1

## 2015-02-10 MED ORDER — LORAZEPAM 0.5 MG PO TABS: 0.5000 mg | ORAL_TABLET | ORAL | Status: DC | PRN

## 2015-02-10 MED ORDER — DOCUSATE SODIUM 100 MG PO CAPS
100.0000 mg | ORAL_CAPSULE | Freq: Two times a day (BID) | ORAL | Status: DC
  Administered 2015-02-10 – 2015-02-11 (×4): 100 mg via ORAL
  Filled 2015-02-10 (×4): qty 1

## 2015-02-10 MED ORDER — ACETAMINOPHEN 325 MG PO TABS
650.0000 mg | ORAL_TABLET | Freq: Four times a day (QID) | ORAL | Status: DC | PRN
  Administered 2015-02-11: 650 mg via ORAL
  Filled 2015-02-10: qty 2

## 2015-02-10 MED ORDER — METOPROLOL SUCCINATE ER 100 MG PO TB24
100.0000 mg | ORAL_TABLET | Freq: Every day | ORAL | Status: DC
  Administered 2015-02-10 – 2015-02-12 (×3): 100 mg via ORAL
  Filled 2015-02-10 (×3): qty 1

## 2015-02-10 MED ORDER — ONDANSETRON HCL 4 MG PO TABS: 4.0000 mg | ORAL_TABLET | Freq: Four times a day (QID) | ORAL | Status: DC | PRN

## 2015-02-10 MED ORDER — OCUVITE-LUTEIN PO CAPS
1.0000 | ORAL_CAPSULE | Freq: Two times a day (BID) | ORAL | Status: DC
  Administered 2015-02-10 – 2015-02-12 (×5): 1 via ORAL
  Filled 2015-02-10 (×5): qty 1

## 2015-02-10 MED ORDER — INSULIN ASPART 100 UNIT/ML ~~LOC~~ SOLN
0.0000 [IU] | Freq: Three times a day (TID) | SUBCUTANEOUS | Status: DC
  Administered 2015-02-10: 09:00:00 3 [IU] via SUBCUTANEOUS
  Administered 2015-02-10: 2 [IU] via SUBCUTANEOUS
  Administered 2015-02-10 – 2015-02-11 (×2): 1 [IU] via SUBCUTANEOUS
  Filled 2015-02-10: qty 1
  Filled 2015-02-10: qty 3
  Filled 2015-02-10: qty 2
  Filled 2015-02-10: qty 1

## 2015-02-10 MED ORDER — LINAGLIPTIN 5 MG PO TABS
5.0000 mg | ORAL_TABLET | Freq: Every day | ORAL | Status: DC
  Administered 2015-02-10 – 2015-02-12 (×3): 5 mg via ORAL
  Filled 2015-02-10 (×3): qty 1

## 2015-02-10 MED ORDER — SODIUM CHLORIDE 0.9 % IV SOLN
INTRAVENOUS | Status: DC
  Administered 2015-02-10 – 2015-02-12 (×6): via INTRAVENOUS

## 2015-02-10 MED ORDER — TRAMADOL HCL 50 MG PO TABS
50.0000 mg | ORAL_TABLET | Freq: Four times a day (QID) | ORAL | Status: DC | PRN
  Administered 2015-02-10 – 2015-02-11 (×2): 50 mg via ORAL
  Filled 2015-02-10 (×2): qty 1

## 2015-02-10 MED ORDER — NAPROXEN 500 MG PO TABS
500.0000 mg | ORAL_TABLET | Freq: Two times a day (BID) | ORAL | Status: DC
  Administered 2015-02-10: 500 mg via ORAL
  Filled 2015-02-10 (×3): qty 1

## 2015-02-10 MED ORDER — TORSEMIDE 5 MG PO TABS
5.0000 mg | ORAL_TABLET | Freq: Every day | ORAL | Status: DC
  Filled 2015-02-10: qty 1

## 2015-02-10 MED ORDER — SENNOSIDES-DOCUSATE SODIUM 8.6-50 MG PO TABS
1.0000 | ORAL_TABLET | Freq: Two times a day (BID) | ORAL | Status: DC
  Administered 2015-02-10 – 2015-02-12 (×5): 1 via ORAL
  Filled 2015-02-10 (×5): qty 1

## 2015-02-10 MED ORDER — GENTAMICIN SULFATE 40 MG/ML IJ SOLN
1.5000 mg/kg | Freq: Once | INTRAVENOUS | Status: AC
  Administered 2015-02-10: 130 mg via INTRAVENOUS
  Filled 2015-02-10: qty 3.25

## 2015-02-10 MED ORDER — APIXABAN 2.5 MG PO TABS
2.5000 mg | ORAL_TABLET | Freq: Two times a day (BID) | ORAL | Status: DC
  Administered 2015-02-10 – 2015-02-12 (×5): 2.5 mg via ORAL
  Filled 2015-02-10 (×5): qty 1

## 2015-02-10 MED ORDER — SIMVASTATIN 40 MG PO TABS
40.0000 mg | ORAL_TABLET | Freq: Every day | ORAL | Status: DC
  Administered 2015-02-11 (×2): 40 mg via ORAL
  Filled 2015-02-10 (×2): qty 1

## 2015-02-10 MED ORDER — INSULIN GLARGINE 100 UNIT/ML ~~LOC~~ SOLN
8.0000 [IU] | Freq: Every day | SUBCUTANEOUS | Status: DC
  Administered 2015-02-11 (×2): 8 [IU] via SUBCUTANEOUS
  Filled 2015-02-10 (×3): qty 0.08

## 2015-02-10 MED ORDER — ONDANSETRON HCL 4 MG/2ML IJ SOLN
4.0000 mg | Freq: Four times a day (QID) | INTRAMUSCULAR | Status: DC | PRN
Start: 2015-02-10 — End: 2015-02-12

## 2015-02-10 MED ORDER — ERTAPENEM SODIUM 1 G IJ SOLR
1.0000 g | INTRAMUSCULAR | Status: DC
  Administered 2015-02-10 – 2015-02-12 (×3): 1 g via INTRAVENOUS
  Filled 2015-02-10 (×4): qty 1

## 2015-02-10 MED ORDER — MECLIZINE HCL 25 MG PO TABS: 25.0000 mg | ORAL_TABLET | Freq: Three times a day (TID) | ORAL | Status: DC | PRN

## 2015-02-10 MED ORDER — ONDANSETRON HCL 4 MG/2ML IJ SOLN
4.0000 mg | Freq: Once | INTRAMUSCULAR | Status: AC
  Administered 2015-02-10: 4 mg via INTRAVENOUS
  Filled 2015-02-10: qty 2

## 2015-02-10 MED ORDER — FENTANYL CITRATE (PF) 100 MCG/2ML IJ SOLN
50.0000 ug | Freq: Once | INTRAMUSCULAR | Status: AC
  Administered 2015-02-10: 50 ug via INTRAVENOUS
  Filled 2015-02-10: qty 2

## 2015-02-10 MED ORDER — TRAMADOL HCL 50 MG PO TABS
ORAL_TABLET | ORAL | Status: AC
  Administered 2015-02-10: 50 mg
  Filled 2015-02-10: qty 1

## 2015-02-10 MED ORDER — FOSFOMYCIN TROMETHAMINE 3 G PO PACK
3.0000 g | PACK | Freq: Once | ORAL | Status: DC
  Filled 2015-02-10: qty 3

## 2015-02-10 MED ORDER — TRAZODONE HCL 100 MG PO TABS
100.0000 mg | ORAL_TABLET | Freq: Every day | ORAL | Status: DC
  Administered 2015-02-11 (×2): 100 mg via ORAL
  Filled 2015-02-10 (×2): qty 1

## 2015-02-10 MED ORDER — OXYCODONE HCL 5 MG PO TABS
5.0000 mg | ORAL_TABLET | ORAL | Status: DC | PRN
  Administered 2015-02-10 – 2015-02-12 (×6): 5 mg via ORAL
  Filled 2015-02-10 (×6): qty 1

## 2015-02-10 MED ORDER — MAGNESIUM HYDROXIDE 400 MG/5ML PO SUSP
30.0000 mL | Freq: Every day | ORAL | Status: DC
  Administered 2015-02-11 – 2015-02-12 (×2): 30 mL via ORAL
  Filled 2015-02-10 (×3): qty 30

## 2015-02-10 MED ORDER — ACETAMINOPHEN 650 MG RE SUPP: 650.0000 mg | Freq: Four times a day (QID) | RECTAL | Status: DC | PRN

## 2015-02-10 MED ORDER — GENTAMICIN SULFATE 40 MG/ML IJ SOLN
450.0000 mg | Freq: Once | INTRAVENOUS | Status: AC
  Administered 2015-02-10: 450 mg via INTRAVENOUS
  Filled 2015-02-10: qty 11.25

## 2015-02-10 MED ORDER — PHENAZOPYRIDINE HCL 200 MG PO TABS
200.0000 mg | ORAL_TABLET | Freq: Three times a day (TID) | ORAL | Status: DC | PRN
  Administered 2015-02-10: 14:00:00 200 mg via ORAL
  Filled 2015-02-10 (×3): qty 1

## 2015-02-10 MED ORDER — FOLIC ACID 1 MG PO TABS
500.0000 ug | ORAL_TABLET | Freq: Every day | ORAL | Status: DC
  Administered 2015-02-10 – 2015-02-12 (×3): 0.5 mg via ORAL
  Filled 2015-02-10: qty 0.5
  Filled 2015-02-10: qty 1
  Filled 2015-02-10: qty 0.5
  Filled 2015-02-10 (×2): qty 1

## 2015-02-10 MED ORDER — SILVER SULFADIAZINE 1 % EX CREA
1.0000 "application " | TOPICAL_CREAM | Freq: Every day | CUTANEOUS | Status: DC
  Administered 2015-02-10 – 2015-02-12 (×2): 1 via TOPICAL
  Filled 2015-02-10: qty 85

## 2015-02-10 MED ORDER — OXYBUTYNIN CHLORIDE 5 MG PO TABS
5.0000 mg | ORAL_TABLET | Freq: Two times a day (BID) | ORAL | Status: DC
  Administered 2015-02-10 – 2015-02-12 (×5): 5 mg via ORAL
  Filled 2015-02-10 (×5): qty 1

## 2015-02-10 MED ORDER — HEPARIN SODIUM (PORCINE) 5000 UNIT/ML IJ SOLN: 5000.0000 [IU] | Freq: Three times a day (TID) | INTRAMUSCULAR | Status: DC

## 2015-02-10 NOTE — Consult Note (Signed)
@ENCDATE @ 4:31 PM   [name] 1920-05-23 [id number]  Referring provider: Dr. Governor Specking  Chief Complaint  Patient presents with  . Abdominal Pain    HPI: The patient is a 81 year old gentleman with a past medical history of urinary retention status post suprapubic catheter placement presents for recurrent urinary tract infections. He recently had a Pseudomonas urinary tract infection. It was sensitive to ciprofloxacin. Prior to this he had a MRSA skin infection at the site of the catheter insertion. He been under the care of Dr. Gareth Eagle at [facility]. He was getting his SP tube changed every 3 months in the IR suite. Outside of his MRSA infection and his 1 recent UTI, he has had no other systematic UTIs. He does know of vague pain in his lower abdomen. This occurs at the same time every day and only briefly. He cannot localize it GU his tube or his penis. His SP tube was last changed 2 weeks ago.  Interval history: The patient is SP tube change approximately one week ago. He presented to the hospital with abdominal pain and was diagnosed with an ESBL urinary tract infection. His 60 French super pubic tube malfunction. It was exchanged with the nursing staff however was a large amount of white thick drainage to urology was asked to evaluate the patient. The patient some suprapubic pain. He feels a little better after the SP tube was changed, however he is unsure of his bladder feels empty.   PMH: Past Medical History  Diagnosis Date  . Coronary artery disease   . Heart attack ([place])   . Diabetes mellitus type 2, uncomplicated ([place])   . Atrial fibrillation ([place])   . Skin cancer   . Chronic kidney disease   . CHF (congestive heart failure) ([place])   . Muscle weakness   . Bladder neck obstruction   . Atrial fibrillation ([place])   . Cardiomyopathy ([place])   . Depression   . Anxiety     Surgical History: Past Surgical History  Procedure Laterality Date  . Coronary artery bypass graft   1989  . Skin cancer excision    . Laminectomy    . Coronary angioplasty    . Cataract extraction Left   . Suprapubic catheter placement    . Appendectomy      Home Medications:    Medication List    ASK your doctor about these medications        acetaminophen 325 MG tablet  Commonly known as:  TYLENOL  Take 650 mg by mouth every 4 (four) hours as needed for mild pain, fever or headache.     apixaban 2.5 MG Tabs tablet  Commonly known as:  ELIQUIS  Take 2.5 mg by mouth 2 (two) times daily.     feeding supplement (GLUCERNA SHAKE) Liqd  Take 237 mLs by mouth 3 (three) times daily between meals.     folic acid A999333 MCG tablet  Commonly known as:  FOLVITE  Take 400 mcg by mouth daily.     insulin glargine 100 UNIT/ML injection  Commonly known as:  LANTUS  Inject 12 Units into the skin at bedtime.     loperamide 2 MG capsule  Commonly known as:  IMODIUM  Take 2-4 mg by mouth every 3 (three) hours as needed for diarrhea or loose stools.     LORazepam 0.5 MG tablet  Commonly known as:  ATIVAN  Take 0.5 mg by mouth every 4 (four) hours as needed for anxiety.  meclizine 25 MG tablet  Commonly known as:  ANTIVERT  Take 25 mg by mouth 3 (three) times daily as needed for dizziness.     metoprolol succinate 100 MG 24 hr tablet  Commonly known as:  TOPROL-XL  Take 100 mg by mouth daily.     MILK OF MAGNESIA PO  Take 30 mLs by mouth daily.     MONUROL 3 G Pack  Generic drug:  fosfomycin     naproxen 500 MG tablet  Commonly known as:  NAPROSYN  Take 500 mg by mouth 2 (two) times daily with a meal.     oxybutynin 5 MG tablet  Commonly known as:  DITROPAN  Take 5 mg by mouth 2 (two) times daily.     oxyCODONE 5 MG immediate release tablet  Commonly known as:  Oxy IR/ROXICODONE  Take 5 mg by mouth every 4 (four) hours as needed for severe pain.     phenazopyridine 200 MG tablet  Commonly known as:  PYRIDIUM  Take 1 tablet (200 mg total) by mouth 3 (three) times  daily as needed for pain.     PRESERVISION AREDS 2 PO  Take 1 capsule by mouth 2 (two) times daily.     THEREMS-M PO  Take 1 tablet by mouth daily.     SENNA S 8.6-50 MG tablet  Generic drug:  senna-docusate  Take 1 tablet by mouth 2 (two) times daily.     silver sulfADIAZINE 1 % cream  Commonly known as:  SILVADENE  Apply 1 application topically daily.     simvastatin 40 MG tablet  Commonly known as:  ZOCOR  Take 40 mg by mouth at bedtime.     sitaGLIPtin 50 MG tablet  Commonly known as:  JANUVIA  Take 50 mg by mouth daily.     torsemide 5 MG tablet  Commonly known as:  DEMADEX  Take 5 mg by mouth daily.     traMADol 50 MG tablet  Commonly known as:  ULTRAM  Take 50 mg by mouth every 6 (six) hours as needed for moderate pain.     traZODone 100 MG tablet  Commonly known as:  DESYREL  Take 100 mg by mouth at bedtime.     venlafaxine XR 37.5 MG 24 hr capsule  Commonly known as:  EFFEXOR-XR  Take 37.5 mg by mouth daily with breakfast.     vitamin B-12 1000 MCG tablet  Commonly known as:  CYANOCOBALAMIN  Take 1,000 mcg by mouth daily.        Allergies:  Allergies  Allergen Reactions  . Ambien [Zolpidem Tartrate] Other (See Comments)    Reaction:  Altered mental status   . Morphine And Related Other (See Comments)    Reaction:  Altered mental status   . Penicillins Other (See Comments)    Reaction:  Unknown     Family History: Family History  Problem Relation Age of Onset  . Diabetes Mellitus II Mother   . Diabetes Mellitus II Brother   . CAD Brother     Social History:  reports that he has never smoked. He has never used smokeless tobacco. He reports that he does not drink alcohol or use illicit drugs.  ROS: 12 point ROS negative except per HPI  Physical Exam: BP 118/87 mmHg  Pulse 86  Temp(Src) 97.8 F (36.6 C) (Oral)  Resp 18  SpO2 95%  Constitutional:  Alert and oriented, No  acute distress. HEENT: [place] AT, moist mucus membranes.  Trachea midline, no masses. Cardiovascular: No clubbing, cyanosis, or edema. Respiratory: Normal respiratory effort, no increased work of breathing. GI: Abdomen is soft, nontender, nondistended, no abdominal masses GU: No CVA tenderness. The suprapubic tube is draining thick white fluid that is consistent with past cystitis. Phallus exams consistent with that of a penile prosthesis. The site of erosion. Skin: No rashes, bruises or suspicious lesions. Lymph: No cervical or inguinal adenopathy. Neurologic: Grossly intact, no focal deficits, moving all 4 extremities. Psychiatric: Normal mood and affect.  Laboratory Data: Lab Results  Component Value Date   WBC 8.9 02/10/2015   HGB 11.8* 02/10/2015   HCT 36.8* 02/10/2015   MCV 90.9 02/10/2015   PLT 157 02/10/2015    Lab Results  Component Value Date   CREATININE 1.74* 02/10/2015    No results found for: PSA  No results found for: TESTOSTERONE  Lab Results  Component Value Date   HGBA1C 7.2* 10/20/2014    Urinalysis    Component Value Date/Time   COLORURINE AMBER* 02/10/2015 0220   COLORURINE Yellow 07/17/2014 1352   APPEARANCEUR TURBID* 02/10/2015 0220   APPEARANCEUR Cloudy 07/17/2014 1352   LABSPEC 1.010 02/10/2015 0220   LABSPEC 1.016 07/17/2014 1352   PHURINE 6.0 02/10/2015 0220   PHURINE 6.0 07/17/2014 1352   GLUCOSEU 50* 02/10/2015 0220   GLUCOSEU >=500 07/17/2014 1352   HGBUR NEGATIVE 02/10/2015 0220   HGBUR 3+ 07/17/2014 1352   BILIRUBINUR NEGATIVE 02/10/2015 0220   BILIRUBINUR Negative 07/17/2014 1352   KETONESUR NEGATIVE 02/10/2015 0220   KETONESUR Negative 07/17/2014 1352   PROTEINUR 100* 02/10/2015 0220   PROTEINUR 100 mg/dL 07/17/2014 1352   NITRITE POSITIVE* 02/10/2015 0220   NITRITE Negative 07/17/2014 1352   LEUKOCYTESUR 3+* 02/10/2015 0220   LEUKOCYTESUR 3+ 07/17/2014 1352      Assessment & Plan:    1. Pyocystitis I placed a 20 French  Foley catheter per urethra atraumatically to aid in draining his pyocystitis fluid as a 16 French SP tube is not large enough to significantly draining this debris. He will need to continue his antibiotics for his ESBL UTI. He will need with discharge home with both his SP tube and his Foley catheter. When his infection resolves, we will plan to upsize his SP tube to a larger size of the continues to to occlude some debris has bladder. In the interim, we will ask the nurses fluch his Foley catheter 120 cc normal saline every 6 hours.  2. Urinary retention As above  We'll check on the patient in the morning to ensure that his pyocystitis is clearing up.   Nickie Retort, MD  [facility] [address] [place], [place] [address] [phone]

## 2015-02-10 NOTE — Progress Notes (Signed)
[facility] [place] NAME: [name]    MR#:  [id number]  DATE OF BIRTH:  May 09, 1920  SUBJECTIVE: 81 year old male patient admitted for abdominal pain. Found to have a complicated UTI. Patient denies any complaints. Has tenderness to palpation to abdominal pain.  CHIEF COMPLAINT:   Chief Complaint  Patient presents with  . Abdominal Pain    REVIEW OF SYSTEMS:   ROS CONSTITUTIONAL: Malaise.,chills EYES: No blurred or double vision.  EARS, NOSE, AND THROAT: No tinnitus or ear pain.  RESPIRATORY: No cough, shortness of breath, wheezing or hemoptysis.  CARDIOVASCULAR: No chest pain, orthopnea, edema.  GASTROINTESTINAL: has  abdominal pain, GENITOURINARY: No dysuria, hematuria.  ENDOCRINE: No polyuria, nocturia,  HEMATOLOGY: No anemia, easy bruising or bleeding SKIN: No rash or lesion. MUSCULOSKELETAL: No joint pain or arthritis.   NEUROLOGIC: No tingling, numbness, weakness.  PSYCHIATRY: No anxiety or depression.   DRUG ALLERGIES:   Allergies  Allergen Reactions  . Ambien [Zolpidem Tartrate] Other (See Comments)    Reaction:  Altered mental status   . Morphine And Related Other (See Comments)    Reaction:  Altered mental status   . Penicillins Other (See Comments)    Reaction:  Unknown     VITALS:  Blood pressure 108/58, pulse 64, temperature 97.8 F (36.6 C), temperature source Oral, resp. rate 18, SpO2 95 %.  PHYSICAL EXAMINATION:  GENERAL:  81 y.o.-year-old patient lying in the bed with no acute distress.  EYES: Pupils equal, round, reactive to light and accommodation. No scleral icterus. Extraocular muscles intact.  HEENT: Head atraumatic, normocephalic. Oropharynx and nasopharynx clear.  NECK:  Supple, no jugular venous distention. No thyroid enlargement, no tenderness.  LUNGS: Normal breath sounds bilaterally, no wheezing, rales,rhonchi or crepitation. No use of accessory muscles of respiration.   CARDIOVASCULAR: S1, S2 normal. No murmurs, rubs, or gallops.  ABDOMEN: soft,BS present. Tenderness to palpation in epigastric area. EXTREMITIES: No pedal edema, cyanosis, or clubbing.  NEUROLOGIC: Cranial nerves II through XII are intact. Muscle strength 5/5 in all extremities. Sensation intact. Gait not checked.  PSYCHIATRIC: The patient is alert and oriented x 3.  SKIN: No obvious rash, lesion, or ulcer.    LABORATORY PANEL:   CBC  Recent Labs Lab 02/10/15 0049  WBC 8.9  HGB 11.8*  HCT 36.8*  PLT 157   ------------------------------------------------------------------------------------------------------------------  Chemistries   Recent Labs Lab 02/10/15 0049 02/10/15 0803  NA 133* 133*  K 5.3* 5.4*  CL 101 102  CO2 25 26  GLUCOSE 151* 195*  BUN 43* 43*  CREATININE 1.70* 1.74*  CALCIUM 9.3 9.0  AST 19  --   ALT 20  --   ALKPHOS 81  --   BILITOT 0.8  --    ------------------------------------------------------------------------------------------------------------------  Cardiac Enzymes No results for input(s): TROPONINI in the last 168 hours. ------------------------------------------------------------------------------------------------------------------  RADIOLOGY:  No results found.  EKG:   Orders placed or performed during the hospital encounter of 10/20/14  . ED EKG  . ED EKG  . EKG 12-Lead  . EKG 12-Lead  . EKG    ASSESSMENT AND PLAN:    1,ESBL UTI;stopped gentamicin. He is giving ertapenem. We'll get a PICC line for 14 days of IV antibiotics. Urine cultures, sensitivity results reviewed from October 31. #2 mild acute renal failure secondary to ATN does have chronic kidney disease stage III: Continue gentle hydration and monitor renal function. #3 constipation and abdominal pain #4 diabetes mellitus type 1 complicated: #5  chronic atrial fibrillation: Controlled continue metoprolol,Eliquis. 6.depression/anxiety; DO NOT RESUSCITATE  All  the records are reviewed and case discussed with Care Management/Social Workerr. Management plans discussed with the patient, family and they are in agreement.  CODE STATUS: DNR  TOTAL TIME TAKING CARE OF THIS PATIENT: 35 minutes.   POSSIBLE D/C IN 1-2DAYS, DEPENDING ON CLINICAL CONDITION.   Epifanio Lesches M.D on 02/10/2015 at 10:15 AM  Between 7am to 6pm - Pager - [phone]  After 6pm go to [email or web address] - password [email or web address] [facility]  Office  [phone]  CC: Primary care physician; [name]., MD   Note: This dictation was prepared with Dragon dictation along with smaller phrase technology. Any transcriptional errors that result from this process are unintentional.

## 2015-02-10 NOTE — Progress Notes (Signed)
Initial Nutrition Assessment   INTERVENTION:   Meals and Snacks: Cater to patient preferences Medical Food Supplement Therapy: Agree with Glucerna TID, each supplement provides 220 kcal and 10 grams of protein Coordination of Care: recommend new weight, last weight from outpatient office on 02/02/2015 and daily weights as pt with h/o CHF   NUTRITION DIAGNOSIS:   Increased nutrient needs related to wound healing as evidenced by estimated needs.  GOAL:   Patient will meet greater than or equal to 90% of their needs  MONITOR:    (Energy Intake, Glucose Profile, Skin)  REASON FOR ASSESSMENT:    (Pressure Ulcer)    ASSESSMENT:   Pt admitted with abdominal pain and weakness secondary to complicated UTI. Pt having bladder spasms per Nsg this am.   Past Medical History  Diagnosis Date  . Coronary artery disease   . Heart attack ([place])   . Diabetes mellitus type 2, uncomplicated ([place])   . Atrial fibrillation ([place])   . Skin cancer   . Chronic kidney disease   . CHF (congestive heart failure) ([place])   . Muscle weakness   . Bladder neck obstruction   . Atrial fibrillation ([place])   . Cardiomyopathy ([place])   . Depression   . Anxiety      Diet Order:  Diet heart healthy/carb modified Room service appropriate?: Yes; Fluid consistency:: Thin    Current Nutrition: Per family pt has been sleeping this am after medication was given for abdominal pain. Pt slept through interview with RD in room this am.   Food/Nutrition-Related History: Pt family reports pt appetite has been very good PTA, pt eating 3 meals per day as usual with Ensure daily. Per family no difficulty chewing or swallowing PTA.   Scheduled Medications:  . apixaban  2.5 mg Oral BID  . docusate sodium  100 mg Oral BID  . ertapenem  1 g Intravenous Q24H  . feeding supplement (GLUCERNA SHAKE)  237 mL Oral TID BM  . folic acid  [id number] mcg Oral Daily  . fosfomycin  3 g Oral Once  . insulin aspart  0-9 Units Subcutaneous  TID WC  . insulin glargine  8 Units Subcutaneous QHS  . linagliptin  5 mg Oral Daily  . magnesium hydroxide  30 mL Oral Daily  . metoprolol succinate  100 mg Oral Daily  . multivitamin-lutein  1 capsule Oral BID  . naproxen  500 mg Oral BID WC  . oxybutynin  5 mg Oral BID  . senna-docusate  1 tablet Oral BID  . silver sulfADIAZINE  1 application Topical Daily  . simvastatin  40 mg Oral QHS  . traZODone  100 mg Oral QHS  . venlafaxine XR  37.5 mg Oral Q breakfast  . vitamin B-12  1,000 mcg Oral Daily    Continuous Medications:  . sodium chloride 125 mL/hr at 02/10/15 0838     Electrolyte/Renal Profile and Glucose Profile:   Recent Labs Lab 02/10/15 0049 02/10/15 0803  NA 133* 133*  K 5.3* 5.4*  CL 101 102  CO2 25 26  BUN 43* 43*  CREATININE 1.70* 1.74*  CALCIUM 9.3 9.0  GLUCOSE 151* 195*   Protein Profile:  Recent Labs Lab 02/10/15 0049  ALBUMIN 3.1*    Gastrointestinal Profile: Last BM:  unknown   Nutrition-Focused Physical Exam Findings:  Unable to complete Nutrition-Focused physical exam at this time.    Weight Change: Pt family reports weight gain recently.   Skin:   (Stage II heel and  coccyx pressure ulcers)   Height:   Ht Readings from Last 1 Encounters:  02/02/15 6' (1.829 m)    Weight:   Wt Readings from Last 1 Encounters:  02/02/15 196 lb 8 oz (89.132 kg)    Wt Readings from Last 10 Encounters:  02/02/15 196 lb 8 oz (89.132 kg)  10/24/14 183 lb 3.2 oz (83.099 kg)  08/31/14 191 lb (86.637 kg)  05/05/14 188 lb (85.276 kg)     BMI:  There is no weight on file to calculate BMI.  Estimated Nutritional Needs:   Kcal:  BEE: 1573kcals, TEE: (IF 1.1-1.3)(AF 1.2) [id number]  Protein:  106-133g protein (1.2-1.5g/kg)  Fluid:  2225-2639mL of fluid (25-82mL/kg)  EDUCATION NEEDS:   No education needs identified at this time   [place], RD, LDN Pager [phone]

## 2015-02-10 NOTE — Progress Notes (Signed)
ANTIBIOTIC CONSULT NOTE - INITIAL  Pharmacy Consult for Ertapenem Indication: ESBL UTI  Allergies  Allergen Reactions  . Ambien [Zolpidem Tartrate] Other (See Comments)    Reaction:  Altered mental status   . Morphine And Related Other (See Comments)    Reaction:  Altered mental status   . Penicillins Other (See Comments)    Reaction:  Unknown     Patient Measurements:  Vital Signs: Temp: 97.8 F (36.6 C) (11/17 0642) Temp Source: Oral (11/17 0642) BP: 108/58 mmHg (11/17 0614) Pulse Rate: 64 (11/17 0642) Intake/Output from previous day:   Intake/Output from this shift:    Labs:  Recent Labs  02/10/15 0049 02/10/15 0803  WBC 8.9  --   HGB 11.8*  --   PLT 157  --   CREATININE 1.70* 1.74*   Estimated Creatinine Clearance: 29.7 mL/min (by C-G formula based on Cr of 1.74).   Microbiology: Recent Results (from the past 720 hour(s))  Urine culture     Status: None   Collection Time: 01/24/15  9:00 PM  Result Value Ref Range Status   Specimen Description URINE, CLEAN CATCH  Final   Special Requests NONE  Final   Culture   Final    >=100,000 COLONIES/mL ESCHERICHIA COLI ESBL-EXTENDED SPECTRUM BETA LACTAMASE-THE ORGANISM IS RESISTANT TO PENICILLINS, CEPHALOSPORINS AND AZTREONAM ACCORDING TO CLSI M100-S15 VOL.[place]. CRITICAL RESULT CALLED TO, READ BACK BY AND VERIFIED WITH: SHIRLEY ALBERT,LPN [id number] BY JRS.    Report Status 01/29/2015 FINAL  Final   Organism ID, Bacteria ESCHERICHIA COLI  Final      Susceptibility   Escherichia coli - MIC*    AMPICILLIN >=32 RESISTANT Resistant     CEFAZOLIN >=64 RESISTANT Resistant     CEFTRIAXONE >=64 RESISTANT Resistant     CIPROFLOXACIN >=4 RESISTANT Resistant     GENTAMICIN <=1 SENSITIVE Sensitive     IMIPENEM <=0.25 SENSITIVE Sensitive     NITROFURANTOIN 128 RESISTANT Resistant     TRIMETH/SULFA >=320 RESISTANT Resistant     Extended ESBL POSITIVE Resistant     PIP/TAZO Value in next row Sensitive       SENSITIVE<=4    LEVOFLOXACIN Value in next row Resistant      RESISTANT>=8    * >=100,000 COLONIES/mL ESCHERICHIA COLI    Medical History: Past Medical History  Diagnosis Date  . Coronary artery disease   . Heart attack ([place])   . Diabetes mellitus type 2, uncomplicated ([place])   . Atrial fibrillation ([place])   . Skin cancer   . Chronic kidney disease   . CHF (congestive heart failure) ([place])   . Muscle weakness   . Bladder neck obstruction   . Atrial fibrillation ([place])   . Cardiomyopathy ([place])   . Depression   . Anxiety     Medications:  Scheduled:  . apixaban  2.5 mg Oral BID  . docusate sodium  100 mg Oral BID  . ertapenem  1 g Intravenous Q24H  . feeding supplement (GLUCERNA SHAKE)  237 mL Oral TID BM  . folic acid  [id number] mcg Oral Daily  . fosfomycin  3 g Oral Once  . insulin aspart  0-9 Units Subcutaneous TID WC  . insulin glargine  8 Units Subcutaneous QHS  . linagliptin  5 mg Oral Daily  . magnesium hydroxide  30 mL Oral Daily  . metoprolol succinate  100 mg Oral Daily  . multivitamin-lutein  1 capsule Oral BID  . naproxen  500 mg Oral  BID WC  . oxybutynin  5 mg Oral BID  . senna-docusate  1 tablet Oral BID  . silver sulfADIAZINE  1 application Topical Daily  . simvastatin  40 mg Oral QHS  . torsemide  5 mg Oral Daily  . traZODone  100 mg Oral QHS  . venlafaxine XR  37.5 mg Oral Q breakfast  . vitamin B-12  1,000 mcg Oral Daily   Assessment: JD is a 81yo male admitted for abdominal pain and weakness 2/2 to UTI. Urine Cx from October revels ESBL organism, therefore, treating UTI empirically for the same. Pharmacy consulted to dose ertapenem in this patient.   Plan:  Initiate ertapenem 1g IV q24hrs. Patient's renal function is borderline for dose reduction, continue to monitor for changes.   Of note, patient has a penicillin allergy with unknown reaction. It is unlikely this patient will have a reaction with ertapenem.  Pharmacy will continue to  monitor.  Vena Rua 02/10/2015,9:52 AM

## 2015-02-10 NOTE — Progress Notes (Signed)
Spoke with Dr Vianne Bulls made aware that pt SP cath leaking around site, and not draining properly, bladder scan result 468ml, new order to contact on call urology and place consult

## 2015-02-10 NOTE — Care Management Obs Status (Signed)
[facility] NOTIFICATION   Patient Details  Name: AHSIR SIMA MRN: [id number] Date of Birth: 09/20/1920   Medicare Observation Status Notification Given:  Yes    Shelbie Ammons, RN 02/10/2015, 10:39 AM

## 2015-02-10 NOTE — H&P (Signed)
[name] is an 81 y.o. male.   Chief Complaint: Abdominal pain HPI: The patient presents emergency department complaining of abdominal pain. He denies fevers, nausea or vomiting. He admits to generalized weakness and malaise 1 day. The patient notes that the appearance of urine collected from his suprapubic catheter has darkened and is thicker. In the emergency department urine sample was grossly purulent. He was given a dose of gentamicin per sensitivities from the last urinary tract infection for which the patient was admitted to the hospital. Due to his complicated urinary tract infection and advanced age the emergency department staff called for admission.  Past Medical History  Diagnosis Date  . Coronary artery disease   . Heart attack ([place])   . Diabetes mellitus type 2, uncomplicated ([place])   . Atrial fibrillation ([place])   . Skin cancer   . Chronic kidney disease   . CHF (congestive heart failure) ([place])   . Muscle weakness   . Bladder neck obstruction   . Atrial fibrillation ([place])   . Cardiomyopathy ([place])   . Depression   . Anxiety     Past Surgical History  Procedure Laterality Date  . Coronary artery bypass graft  1989  . Skin cancer excision    . Laminectomy    . Coronary angioplasty    . Cataract extraction Left   . Suprapubic catheter placement    . Appendectomy      Family History  Problem Relation Age of Onset  . Diabetes Mellitus II Mother   . Diabetes Mellitus II Brother   . CAD Brother    Social History:  reports that he has never smoked. He has never used smokeless tobacco. He reports that he does not drink alcohol or use illicit drugs.  Allergies:  Allergies  Allergen Reactions  . Ambien [Zolpidem Tartrate] Other (See Comments)    Reaction:  Altered mental status   . Morphine And Related Other (See Comments)    Reaction:  Altered mental status   . Penicillins Other (See Comments)    Reaction:  Unknown      (Not in a hospital  admission)  Results for orders placed or performed during the hospital encounter of 02/09/15 (from the past 48 hour(s))  CBC     Status: Abnormal   Collection Time: 02/10/15 12:49 AM  Result Value Ref Range   WBC 8.9 3.8 - 10.6 K/uL   RBC 4.05 (L) 4.40 - 5.90 MIL/uL   Hemoglobin 11.8 (L) 13.0 - 18.0 g/dL   HCT 36.8 (L) 40.0 - 52.0 %   MCV 90.9 80.0 - 100.0 fL   MCH 29.0 26.0 - 34.0 pg   MCHC 31.9 (L) 32.0 - 36.0 g/dL   RDW 15.9 (H) 11.5 - 14.5 %   Platelets 157 150 - 440 K/uL  Comprehensive metabolic panel     Status: Abnormal   Collection Time: 02/10/15 12:49 AM  Result Value Ref Range   Sodium 133 (L) 135 - 145 mmol/L   Potassium 5.3 (H) 3.5 - 5.1 mmol/L   Chloride 101 101 - 111 mmol/L   CO2 25 22 - 32 mmol/L   Glucose, Bld 151 (H) 65 - 99 mg/dL   BUN 43 (H) 6 - 20 mg/dL   Creatinine, Ser 1.70 (H) 0.61 - 1.24 mg/dL   Calcium 9.3 8.9 - 10.3 mg/dL   Total Protein 6.5 6.5 - 8.1 g/dL   Albumin 3.1 (L) 3.5 - 5.0 g/dL   AST 19 15 - 41  U/L   ALT 20 17 - 63 U/L   Alkaline Phosphatase 81 38 - 126 U/L   Total Bilirubin 0.8 0.3 - 1.2 mg/dL   GFR calc non Af Amer 33 (L) >60 mL/min   GFR calc Af Amer 38 (L) >60 mL/min    Comment: (NOTE) The eGFR has been calculated using the CKD EPI equation. This calculation has not been validated in all clinical situations. eGFR's persistently <60 mL/min signify possible Chronic Kidney Disease.    Anion gap 7 5 - 15  Urinalysis complete, with microscopic ([facility] only)     Status: Abnormal   Collection Time: 02/10/15  2:20 AM  Result Value Ref Range   Color, Urine AMBER (A) YELLOW   APPearance TURBID (A) CLEAR   Glucose, UA 50 (A) NEGATIVE mg/dL   Bilirubin Urine NEGATIVE NEGATIVE   Ketones, ur NEGATIVE NEGATIVE mg/dL   Specific Gravity, Urine 1.010 1.005 - 1.030   Hgb urine dipstick NEGATIVE NEGATIVE   pH 6.0 5.0 - 8.0   Protein, ur 100 (A) NEGATIVE mg/dL   Nitrite POSITIVE (A) NEGATIVE   Leukocytes, UA 3+ (A) NEGATIVE   RBC / HPF 6-30 0 -  5 RBC/hpf   WBC, UA TOO NUMEROUS TO COUNT 0 - 5 WBC/hpf   Bacteria, UA RARE (A) NONE SEEN   Squamous Epithelial / LPF NONE SEEN NONE SEEN   WBC Clumps PRESENT    Budding Yeast PRESENT    Hyphae Yeast PRESENT    No results found.  Review of Systems  Constitutional: Positive for chills and malaise/fatigue. Negative for fever.  HENT: Negative for sore throat and tinnitus.   Eyes: Negative for blurred vision and redness.  Respiratory: Negative for cough and shortness of breath.   Cardiovascular: Negative for chest pain, palpitations, orthopnea and PND.  Gastrointestinal: Positive for abdominal pain. Negative for nausea, vomiting and diarrhea.  Genitourinary: Negative for dysuria, urgency and frequency.  Musculoskeletal: Negative for myalgias and joint pain.  Skin: Negative for rash.       No lesions  Neurological: Negative for speech change, focal weakness and weakness.  Endo/Heme/Allergies: Does not bruise/bleed easily.       No temperature intolerance  Psychiatric/Behavioral: Negative for depression and suicidal ideas.    Blood pressure 107/75, pulse 116, temperature 97.8 F (36.6 C), temperature source Oral, resp. rate 16, SpO2 99 %. Physical Exam  Nursing note and vitals reviewed. Constitutional: He is oriented to person, place, and time. He appears well-developed and well-nourished. No distress.  HENT:  Head: Normocephalic and atraumatic.  Mouth/Throat: Oropharynx is clear and moist.  Eyes: Conjunctivae and EOM are normal. Pupils are equal, round, and reactive to light. No scleral icterus.  Neck: Normal range of motion. Neck supple. No JVD present. No tracheal deviation present. No thyromegaly present.  Cardiovascular: Normal rate and regular rhythm.  Exam reveals no gallop and no friction rub.   No murmur heard. Respiratory: Effort normal and breath sounds normal.  GI: Soft. Bowel sounds are normal. He exhibits no distension and no mass. There is tenderness. There is no  rebound and no guarding.  Suprapubic cath in place  Genitourinary:  Deferred  Musculoskeletal: Normal range of motion. He exhibits no edema.  Lymphadenopathy:    He has no cervical adenopathy.  Neurological: He is alert and oriented to person, place, and time. No cranial nerve deficit.  Skin: Skin is warm and dry. No rash noted. No erythema.  Psychiatric: He has a normal mood and affect. His  behavior is normal. Judgment and thought content normal.     Assessment/Plan This is a 81 year old male admitted for urinary tract infection. 1. Urinary tract infection: Complicated. No signs or symptoms of sepsis. Urine culture has been obtained and the patient has been empirically started on gentamicin per sensitivities from his last UTI. Noted chronic kidney disease. Pharmacy to dose antibiotics. 2. Coronary artery disease: Stable. Continue metoprolol 3. Chronic kidney disease: Stage III; marginally worse from previous admission. Gentle IV hydration 4. Diabetes mellitus type 2: Continue basal insulin dose adjusted for hospital diet and start sliding scale insulin. Continue Tradjenta as it has negligible risk for hypoglycemia. 5. Atrial fibrillation: Generally rate controlled; continue Eliquis  6. DVT prophylaxis: As above 7. GI prophylaxis: None The patient is a DO NOT RESUSCITATE. Time spent on admission orders and patient care approximately 35 minutes.  Harrie Foreman 02/10/2015, 5:19 AM

## 2015-02-10 NOTE — ED Notes (Signed)
Pt to floor via stretcher with tech

## 2015-02-10 NOTE — Care Management (Signed)
Admitted to [facility] with the diagnosis of urinary tract infection. A resident of [facility] skilled facility since January 2016. Son is [employer]. [phone]). SupraPubic catheter in place. Primary Care physician is Dr. Ouida Sills. Family at the bedside. Shelbie Ammons RN MSN CCM Care Management [phone]

## 2015-02-10 NOTE — ED Provider Notes (Signed)
[facility] Emergency Department Provider Note  ____________________________________________  Time seen: 12:30 AM  I have reviewed the triage vital signs and the nursing notes.   HISTORY  Chief Complaint Abdominal Pain     HPI [name] is a 81 y.o. male presents from [facility] via EMS with pain redness and purulent drainage around suprapubic catheter. Patient denies any fever no nausea or vomiting current pain score 9 out of 10. Of note patient states that he recently had a Escherichia coli urinary tract infection. Review of the patient's laboratory data revealed sensitivity to gentamicin     Past Medical History  Diagnosis Date  . Coronary artery disease   . Heart attack ([place])   . Diabetes mellitus type 2, uncomplicated ([place])   . Atrial fibrillation ([place])   . Skin cancer   . Chronic kidney disease   . CHF (congestive heart failure) ([place])   . Muscle weakness   . Bladder neck obstruction   . Atrial fibrillation ([place])   . Cardiomyopathy ([place])   . Depression   . Anxiety     Patient Active Problem List   Diagnosis Date Noted  . Complicated UTI (urinary tract infection) 02/10/2015  . Urinary retention 02/02/2015  . Recurrent UTI 02/02/2015  . Pressure ulcer 10/22/2014  . UTI (lower urinary tract infection) 10/21/2014  . Chronic systolic heart failure ([place]) 08/02/2014  . Diabetes ([place]) 08/02/2014  . Hypotension 08/02/2014  . Bladder outlet obstruction 08/02/2014  . Neuritis or radiculitis due to rupture of lumbar intervertebral disc 07/02/2014  . Lumbar canal stenosis 07/02/2014  . Abdominal aortic atherosclerosis ([place]) 02/21/2014  . Diabetic peripheral neuropathy ([place]) 10/30/2013  . A-fib ([place]) 08/22/2013  . Arteriosclerosis of coronary artery 08/22/2013  . Accelerated essential hypertension 08/22/2013  . HLD (hyperlipidemia) 07/26/2013  . Chronic kidney disease (CKD), stage III (moderate) 08/20/2012    Past Surgical  History  Procedure Laterality Date  . Coronary artery bypass graft  1989  . Skin cancer excision    . Laminectomy    . Coronary angioplasty    . Cataract extraction Left   . Suprapubic catheter placement    . Appendectomy      Current Outpatient Rx  Name  Route  Sig  Dispense  Refill  . acetaminophen (TYLENOL) 325 MG tablet   Oral   Take 650 mg by mouth every 4 (four) hours as needed for mild pain, fever or headache.          Marland Kitchen apixaban (ELIQUIS) 2.5 MG TABS tablet   Oral   Take 2.5 mg by mouth 2 (two) times daily.         . feeding supplement, GLUCERNA SHAKE, (GLUCERNA SHAKE) LIQD   Oral   Take 237 mLs by mouth 3 (three) times daily between meals.         . folic acid (FOLVITE) A999333 MCG tablet   Oral   Take 400 mcg by mouth daily.         . insulin glargine (LANTUS) 100 UNIT/ML injection   Subcutaneous   Inject 12 Units into the skin at bedtime.          Marland Kitchen loperamide (IMODIUM) 2 MG capsule   Oral   Take 2-4 mg by mouth every 3 (three) hours as needed for diarrhea or loose stools.         Marland Kitchen LORazepam (ATIVAN) 0.5 MG tablet   Oral   Take 0.5 mg by mouth every 4 (four) hours  as needed for anxiety.         . Magnesium Hydroxide (MILK OF MAGNESIA PO)   Oral   Take 30 mLs by mouth daily.          . meclizine (ANTIVERT) 25 MG tablet   Oral   Take 25 mg by mouth 3 (three) times daily as needed for dizziness.         . metoprolol succinate (TOPROL-XL) 100 MG 24 hr tablet   Oral   Take 100 mg by mouth daily.       98   . Multiple Vitamins-Minerals (PRESERVISION AREDS 2 PO)   Oral   Take 1 capsule by mouth 2 (two) times daily.         . Multiple Vitamins-Minerals (THEREMS-M PO)   Oral   Take 1 tablet by mouth daily.         . naproxen (NAPROSYN) 500 MG tablet   Oral   Take 500 mg by mouth 2 (two) times daily with a meal.         . oxybutynin (DITROPAN) 5 MG tablet   Oral   Take 5 mg by mouth 2 (two) times daily.         Marland Kitchen oxyCODONE  (OXY IR/ROXICODONE) 5 MG immediate release tablet   Oral   Take 5 mg by mouth every 4 (four) hours as needed for severe pain.         Marland Kitchen senna-docusate (SENNA S) 8.6-50 MG per tablet   Oral   Take 1 tablet by mouth 2 (two) times daily.         . silver sulfADIAZINE (SILVADENE) 1 % cream   Topical   Apply 1 application topically daily.         . simvastatin (ZOCOR) 40 MG tablet   Oral   Take 40 mg by mouth at bedtime.          . sitaGLIPtin (JANUVIA) 50 MG tablet   Oral   Take 50 mg by mouth daily.         Marland Kitchen torsemide (DEMADEX) 5 MG tablet   Oral   Take 5 mg by mouth daily.         . traMADol (ULTRAM) 50 MG tablet   Oral   Take 50 mg by mouth every 6 (six) hours as needed for moderate pain.          . traZODone (DESYREL) 100 MG tablet   Oral   Take 100 mg by mouth at bedtime.         Marland Kitchen venlafaxine XR (EFFEXOR-XR) 37.5 MG 24 hr capsule   Oral   Take 37.5 mg by mouth daily with breakfast.       98   . vitamin B-12 (CYANOCOBALAMIN) 1000 MCG tablet   Oral   Take 1,000 mcg by mouth daily.         Marland Kitchen MONUROL 3 G PACK            98     Dispense as written.   . phenazopyridine (PYRIDIUM) 200 MG tablet   Oral   Take 1 tablet (200 mg total) by mouth 3 (three) times daily as needed for pain. Patient not taking: Reported on 02/10/2015   10 tablet   0     Allergies Ambien; Morphine and related; and Penicillins  Family History  Problem Relation Age of Onset  . Diabetes Mellitus II Mother   . Diabetes Mellitus II Brother   .  CAD Brother     Social History Social History  Substance Use Topics  . Smoking status: Never Smoker   . Smokeless tobacco: Never Used  . Alcohol Use: No    Review of Systems  Constitutional: Negative for fever. Eyes: Negative for visual changes. ENT: Negative for sore throat. Cardiovascular: Negative for chest pain. Respiratory: Negative for shortness of breath. Gastrointestinal: Negative for abdominal pain,  vomiting and diarrhea. Genitourinary: Negative for dysuria. Musculoskeletal: Negative for back pain. Skin: Positive for periumbilical redness and pus Neurological: Negative for headaches, focal weakness or numbness.   10-point ROS otherwise negative.  ____________________________________________   PHYSICAL EXAM:  VITAL SIGNS: ED Triage Vitals  Enc Vitals Group     BP 02/10/15 0027 124/85 mmHg     Pulse Rate 02/10/15 0027 77     Resp 02/10/15 0027 16     Temp 02/10/15 0027 97.8 F (36.6 C)     Temp Source 02/10/15 0027 Oral     SpO2 02/10/15 0027 97 %     Weight --      Height --      Head Cir --      Peak Flow --      Pain Score 02/09/15 2352 8     Pain Loc --      Pain Edu? --      Excl. in [place]? --      Constitutional: Alert and oriented. Well appearing and in no distress. Eyes: Conjunctivae are normal. PERRL. Normal extraocular movements. ENT   Head: Normocephalic and atraumatic.   Nose: No congestion/rhinnorhea.   Mouth/Throat: Mucous membranes are moist.   Neck: No stridor. Hematological/Lymphatic/Immunilogical: No cervical lymphadenopathy. Cardiovascular: Tachycardia. Normal and symmetric distal pulses are present in all extremities. No murmurs, rubs, or gallops. Respiratory: Normal respiratory effort without tachypnea nor retractions. Breath sounds are clear and equal bilaterally. No wheezes/rales/rhonchi. Gastrointestinal: Soft and nontender. No distention. There is no CVA tenderness. Genitourinary: deferred Musculoskeletal: Nontender with normal range of motion in all extremities. No joint effusions.  No lower extremity tenderness nor edema. Neurologic:  Normal speech and language. No gross focal neurologic deficits are appreciated. Speech is normal.  Skin:  2 x 2 centimeter area of erythema around the suprapubic catheter with purulent drainage around the catheter Psychiatric: Mood and affect are normal. Speech and behavior are normal. Patient  exhibits appropriate insight and judgment.  ____________________________________________    LABS (pertinent positives/negatives)  Labs Reviewed  CBC - Abnormal; Notable for the following:    RBC 4.05 (*)    Hemoglobin 11.8 (*)    HCT 36.8 (*)    MCHC 31.9 (*)    RDW 15.9 (*)    All other components within normal limits  COMPREHENSIVE METABOLIC PANEL - Abnormal; Notable for the following:    Sodium 133 (*)    Potassium 5.3 (*)    Glucose, Bld 151 (*)    BUN 43 (*)    Creatinine, Ser 1.70 (*)    Albumin 3.1 (*)    GFR calc non Af Amer 33 (*)    GFR calc Af Amer 38 (*)    All other components within normal limits  URINALYSIS COMPLETEWITH MICROSCOPIC ([facility] ONLY) - Abnormal; Notable for the following:    Color, Urine AMBER (*)    APPearance TURBID (*)    Glucose, UA 50 (*)    Protein, ur 100 (*)    Nitrite POSITIVE (*)    Leukocytes, UA 3+ (*)    Bacteria, UA  RARE (*)    All other components within normal limits  CULTURE, BLOOD (ROUTINE X 2)  CULTURE, BLOOD (ROUTINE X 2)          INITIAL IMPRESSION / ASSESSMENT AND PLAN / ED COURSE  Pertinent labs & imaging results that were available during my care of the patient were reviewed by me and considered in my medical decision making (see chart for details).  IV gentamicin administered. Patient discussed with Dr. Marcille Blanco for hospital admission for further evaluation and management  ____________________________________________   FINAL CLINICAL IMPRESSION(S) / ED DIAGNOSES  Final diagnoses:  Complicated UTI (urinary tract infection)      Gregor Hams, MD 02/10/15 [phone]

## 2015-02-10 NOTE — ED Notes (Signed)
MD at bedside. 

## 2015-02-10 NOTE — ED Notes (Signed)
Chesley Noon, RN from [place] called to check on pt, updated nurse on current status will keep them updated.

## 2015-02-10 NOTE — Clinical Social Work Note (Signed)
Clinical Social Work Assessment  Patient Details  Name: [name] MRN: [id number] Date of Birth: 09/10/1920  Date of referral:  02/10/15               Reason for consult:  Facility Placement                Permission sought to share information with:  Family Supports Permission granted to share information::  Yes, Verbal Permission Granted  Name::      (Pt's son)   Housing/Transportation Living arrangements for the past 2 months:  [place] of Information:  Adult Children Patient Interpreter Needed:  None Criminal Activity/Legal Involvement Pertinent to Current Situation/Hospitalization:  No - Comment as needed Significant Relationships:  Adult Children Lives with:  Facility Resident Do you feel safe going back to the place where you live?  Yes Need for family participation in patient care:   Yes  Care giving concerns:  No care giving needs identified.   Social Worker assessment / plan:  CSW met with pt and son to address consult. Pt was sleeping soundly and son was at bedside. CSW introduced herself and explained role of social work. CSW also explained the process of returning to SNF. Pt is a resident of [facility] Facility is able to take pt at discharge. Pt's son is in agreement. CSW will continue to follow.   Employment status:  Retired Forensic scientist:  Medicare PT Recommendations:  Not assessed at this time Information / Referral to community resources:  [place]  Patient/Family's Response to care:  Pt's son was Patent attorney of CSW support.   Patient/Family's Understanding of and Emotional Response to Diagnosis, Current Treatment, and Prognosis:  Pt's son understands that pt needs to return to SNF and may need LTC.   Emotional Assessment Appearance:  Appears stated age Attitude/Demeanor/Rapport:  Unable to Assess, Other (Pt sleeping) Affect (typically observed):  Unable to Assess, Other (Pt sleeping) Orientation:   Oriented to Self, Oriented to Place, Oriented to  Time, Oriented to Situation Alcohol / Substance use:  Never Used Psych involvement (Current and /or in the community):  No (Comment)  Discharge Needs  Concerns to be addressed:  No discharge needs identified Readmission within the last 30 days:  No Current discharge risk:  None Barriers to Discharge:  No Barriers Identified   Darden Dates, LCSW 02/10/2015, 4:09 PM

## 2015-02-10 NOTE — ED Notes (Signed)
Dr. Marcille Blanco at bedside to eval pt for admission

## 2015-02-10 NOTE — Plan of Care (Signed)
Problem: Education: Goal: Knowledge of [facility] Education information/materials will improve Outcome: Progressing Pt lives at [facility]. Pt likes to be called Dr. Keturah Barre Pt has a chronic suprapubic catheter.     Diagnosis  Date   .  Coronary artery disease     .  Heart attack ([place])     .  Diabetes mellitus type 2, uncomplicated ([place])     .  Atrial fibrillation ([place])     .  Skin cancer     .  Chronic kidney disease     .  CHF (congestive heart failure) ([place])     .  Muscle weakness     .  Bladder neck obstruction     .  Atrial fibrillation ([place])     .  Cardiomyopathy ([place])     .  Depression     .  Anxiety             Pt wells controlled with home medications.     Problem: Activity: Goal: Risk for activity intolerance will decrease Outcome: Progressing Pt alert and oriented. Pt was admitted to the our floor this morning. Pt resting well. Iv fluids at 124ml/hr. Continue to monitor.

## 2015-02-10 NOTE — Progress Notes (Signed)
Dr Pilar Jarvis urology made aware of previous note and conversation with Dr Vianne Bulls, new order to place new SP cath 16Fr, and he will call back to follow up

## 2015-02-11 DIAGNOSIS — Z1612 Extended spectrum beta lactamase (ESBL) resistance: Secondary | ICD-10-CM | POA: Diagnosis present

## 2015-02-11 DIAGNOSIS — A499 Bacterial infection, unspecified: Secondary | ICD-10-CM | POA: Diagnosis present

## 2015-02-11 DIAGNOSIS — N390 Urinary tract infection, site not specified: Secondary | ICD-10-CM | POA: Diagnosis not present

## 2015-02-11 DIAGNOSIS — R338 Other retention of urine: Secondary | ICD-10-CM | POA: Diagnosis not present

## 2015-02-11 DIAGNOSIS — N3080 Other cystitis without hematuria: Secondary | ICD-10-CM | POA: Diagnosis not present

## 2015-02-11 LAB — BASIC METABOLIC PANEL
Anion gap: 3 — ABNORMAL LOW (ref 5–15)
Anion gap: 3 — ABNORMAL LOW (ref 5–15)
BUN: 44 mg/dL — AB (ref 6–20)
BUN: 43 mg/dL — AB (ref 6–20)
CHLORIDE: 104 mmol/L (ref 101–111)
CHLORIDE: 105 mmol/L (ref 101–111)
CO2: 24 mmol/L (ref 22–32)
CO2: 24 mmol/L (ref 22–32)
CREATININE: 1.71 mg/dL — AB (ref 0.61–1.24)
Calcium: 8.5 mg/dL — ABNORMAL LOW (ref 8.9–10.3)
Calcium: 8.2 mg/dL — ABNORMAL LOW (ref 8.9–10.3)
Creatinine, Ser: 1.67 mg/dL — ABNORMAL HIGH (ref 0.61–1.24)
GFR calc Af Amer: 38 mL/min — ABNORMAL LOW (ref >60)
GFR calc Af Amer: 39 mL/min — ABNORMAL LOW (ref >60)
GFR calc non Af Amer: 33 mL/min — ABNORMAL LOW (ref >60)
GFR calc non Af Amer: 34 mL/min — ABNORMAL LOW (ref >60)
GLUCOSE: 150 mg/dL — AB (ref 65–99)
GLUCOSE: 157 mg/dL — AB (ref 65–99)
POTASSIUM: 5.8 mmol/L — AB (ref 3.5–5.1)
Potassium: 5.9 mmol/L — ABNORMAL HIGH (ref 3.5–5.1)
SODIUM: 132 mmol/L — AB (ref 135–145)
Sodium: 131 mmol/L — ABNORMAL LOW (ref 135–145)

## 2015-02-11 LAB — GLUCOSE, CAPILLARY
Glucose-Capillary: 83 mg/dL (ref 65–99)
Glucose-Capillary: 147 mg/dL — ABNORMAL HIGH (ref 65–99)
Glucose-Capillary: 144 mg/dL — ABNORMAL HIGH (ref 65–99)
Glucose-Capillary: 148 mg/dL — ABNORMAL HIGH (ref 65–99)
Glucose-Capillary: 151 mg/dL — ABNORMAL HIGH (ref 65–99)

## 2015-02-11 MED ORDER — SODIUM POLYSTYRENE SULFONATE 15 GM/60ML PO SUSP
15.0000 g | Freq: Once | ORAL | Status: AC
  Administered 2015-02-11: 15 g via ORAL
  Filled 2015-02-11: qty 60

## 2015-02-11 MED ORDER — SODIUM CHLORIDE 0.9 % IV SOLN: 1.0000 g | INTRAVENOUS | Status: AC

## 2015-02-11 MED ORDER — INSULIN GLARGINE 100 UNIT/ML ~~LOC~~ SOLN: 10.0000 [IU] | Freq: Every day | SUBCUTANEOUS | Status: AC

## 2015-02-11 MED ORDER — OXYCODONE HCL 5 MG PO TABS: 5.0000 mg | ORAL_TABLET | ORAL | Status: AC | PRN

## 2015-02-11 NOTE — Progress Notes (Signed)
Flushed foley with 120cc normal saline. Madlyn Frankel, RN

## 2015-02-11 NOTE — Plan of Care (Signed)
Problem: Activity: Goal: Risk for activity intolerance will decrease Outcome: Progressing Plan of Care Progress to Goal:   Pt report pain once during shift. Pt has rested comfortably this shift. No other signs of distress noted. Will continue to monitor.

## 2015-02-11 NOTE — Discharge Summary (Addendum)
[facility]   PATIENT NAME: [name]    MR#:  [id number]  DATE OF BIRTH:  1920/10/12  DATE OF ADMISSION:  02/09/2015 ADMITTING PHYSICIAN: Harrie Foreman, MD  DATE OF DISCHARGE: 02/12/2015  PRIMARY CARE PHYSICIAN: [name]., MD    ADMISSION DIAGNOSIS:  Complicated UTI (urinary tract infection) [N39.0]  DISCHARGE DIAGNOSIS:  Active Problems:   Pressure ulcer   Complicated UTI (urinary tract infection)   ESBL (extended spectrum beta-lactamase) producing bacteria infection   SECONDARY DIAGNOSIS:   Past Medical History  Diagnosis Date  . Coronary artery disease   . Heart attack ([place])   . Diabetes mellitus type 2, uncomplicated ([place])   . Atrial fibrillation ([place])   . Skin cancer   . Chronic kidney disease   . CHF (congestive heart failure) ([place])   . Muscle weakness   . Bladder neck obstruction   . Atrial fibrillation ([place])   . Cardiomyopathy ([place])   . Depression   . Anxiety     HOSPITAL COURSE:     1 ESBL UTI: Assumed ESBL Escherichia coli based on culture results from 10/31. He had been treated with fosfomycin in the outpatient setting. Likely his suprapubic catheter obstruction contributed to this infection. Current urine and blood cultures are no growth to date. He is responding very well to ertapenem. He has a PICC line in place and will receive 14 days of antibiotics. We will follow culture results from this and and make changes if needed.  2 urinary retention: Has a chronic suprapubic catheter. He has been followed by urology throughout this hospitalization. He produces a large amount of sediment. Apparently the suprapubic catheter tube is too small to manage the amount of sediment he produces. He currently has a Foley catheter in place as well as the suprapubic catheter. These will remain in place until his urology follow-up appointment. Initially he had pyuria, this is  improving though he still has significant amount of debris in the urine. Both tubes are draining well at this time. He will need standard catheter care with bladder flushes.  #3 mild acute renal failure secondary to ATN does have chronic kidney disease stage III: Improving with gentle hydration. He will need repeat BMP on Monday 11/21. Encourage by mouth intake.  #4 constipation and abdominal pain: Will need to have BM prior to discharge. Will need to monitor closely at skilled nursing due to chronic opiate medications.  #4 diabetes mellitus: A1c 7.7. He has been well controlled in the outpatient setting. Has needed minimal sliding scale. He will continue on Lantus but on a slightly lower dose than his prior dose (new dose is 10 units) continue linagliptin. Monitor blood sugars each morning.  #5 chronic atrial fibrillation: Controlled continue metoprolol,Eliquis.  6.depression/anxiety: Stable  7. Stage I pressure ulcers over both buttocks: These appear to be healing well. He needs every 2 hour repositioning. Needs wound care and minimization of pressure over this area.  8 hyperkalemia: has history of high K, which has been even higher during this admission. Has received Kayexelate several times. Would avoid nutritional suppliments other than those for renal patients which do not have potassium.  Would recheck on Monday 11/21  DO NOT RESUSCITATE  DISCHARGE CONDITIONS:   Fair  CONSULTS OBTAINED:  Treatment Team:  Nickie Retort, MD  DRUG ALLERGIES:   Allergies  Allergen Reactions  . Ambien [Zolpidem Tartrate] Other (See Comments)    Reaction:  Altered  mental status   . Morphine And Related Other (See Comments)    Reaction:  Altered mental status   . Penicillins Other (See Comments)    Reaction:  Unknown     DISCHARGE MEDICATIONS:   Current Discharge Medication List    START taking these medications   Details  ertapenem 1 g in sodium chloride 0.9 % 50 mL Inject 1 g into  the vein daily. Qty: 13 g, Refills: 0      CONTINUE these medications which have CHANGED   Details  insulin glargine (LANTUS) 100 UNIT/ML injection Inject 0.1 mLs (10 Units total) into the skin at bedtime. Qty: 10 mL, Refills: 11    oxyCODONE (OXY IR/ROXICODONE) 5 MG immediate release tablet Take 1 tablet (5 mg total) by mouth every 4 (four) hours as needed for severe pain. Qty: 30 tablet, Refills: 0      CONTINUE these medications which have NOT CHANGED   Details  acetaminophen (TYLENOL) 325 MG tablet Take 650 mg by mouth every 4 (four) hours as needed for mild pain, fever or headache.     apixaban (ELIQUIS) 2.5 MG TABS tablet Take 2.5 mg by mouth 2 (two) times daily.    feeding supplement, GLUCERNA SHAKE, (GLUCERNA SHAKE) LIQD Take 237 mLs by mouth 3 (three) times daily between meals.    folic acid (FOLVITE) A999333 MCG tablet Take 400 mcg by mouth daily.    loperamide (IMODIUM) 2 MG capsule Take 2-4 mg by mouth every 3 (three) hours as needed for diarrhea or loose stools.    LORazepam (ATIVAN) 0.5 MG tablet Take 0.5 mg by mouth every 4 (four) hours as needed for anxiety.    Magnesium Hydroxide (MILK OF MAGNESIA PO) Take 30 mLs by mouth daily.     meclizine (ANTIVERT) 25 MG tablet Take 25 mg by mouth 3 (three) times daily as needed for dizziness.    metoprolol succinate (TOPROL-XL) 100 MG 24 hr tablet Take 100 mg by mouth daily.  Refills: 98    Multiple Vitamins-Minerals (PRESERVISION AREDS 2 PO) Take 1 capsule by mouth 2 (two) times daily.    Multiple Vitamins-Minerals (THEREMS-M PO) Take 1 tablet by mouth daily.    naproxen (NAPROSYN) 500 MG tablet Take 500 mg by mouth 2 (two) times daily with a meal.    oxybutynin (DITROPAN) 5 MG tablet Take 5 mg by mouth 2 (two) times daily.    senna-docusate (SENNA S) 8.6-50 MG per tablet Take 1 tablet by mouth 2 (two) times daily.    silver sulfADIAZINE (SILVADENE) 1 % cream Apply 1 application topically daily.    simvastatin (ZOCOR)  40 MG tablet Take 40 mg by mouth at bedtime.     sitaGLIPtin (JANUVIA) 50 MG tablet Take 50 mg by mouth daily.    torsemide (DEMADEX) 5 MG tablet Take 5 mg by mouth daily.    traMADol (ULTRAM) 50 MG tablet Take 50 mg by mouth every 6 (six) hours as needed for moderate pain.     traZODone (DESYREL) 100 MG tablet Take 100 mg by mouth at bedtime.    venlafaxine XR (EFFEXOR-XR) 37.5 MG 24 hr capsule Take 37.5 mg by mouth daily with breakfast.  Refills: 98    vitamin B-12 (CYANOCOBALAMIN) 1000 MCG tablet Take 1,000 mcg by mouth daily.    phenazopyridine (PYRIDIUM) 200 MG tablet Take 1 tablet (200 mg total) by mouth 3 (three) times daily as needed for pain. Qty: 10 tablet, Refills: 0      STOP taking  these medications     MONUROL 3 G PACK          DISCHARGE INSTRUCTIONS:   He will need standard care for his suprapubic and Foley catheters.  DIET:  Diabetic diet, Low fat, Low cholesterol diet and Encourage fluids  DISCHARGE CONDITION:  Fair  ACTIVITY:  Activity as tolerated, per physical therapy recommendations  OXYGEN:  Home Oxygen: No.   Oxygen Delivery: room air  DISCHARGE LOCATION:  nursing home   If you experience worsening of your admission symptoms, develop shortness of breath, life threatening emergency, suicidal or homicidal thoughts you must seek medical attention immediately by calling 911 or calling your MD immediately  if symptoms less severe.  You Must read complete instructions/literature along with all the possible adverse reactions/side effects for all the Medicines you take and that have been prescribed to you. Take any new Medicines after you have completely understood and accpet all the possible adverse reactions/side effects.   Please note  You were cared for by a hospitalist during your hospital stay. If you have any questions about your discharge medications or the care you received while you were in the hospital after you are discharged, you can  call the unit and asked to speak with the hospitalist on call if the hospitalist that took care of you is not available. Once you are discharged, your primary care physician will handle any further medical issues. Please note that NO REFILLS for any discharge medications will be authorized once you are discharged, as it is imperative that you return to your primary care physician (or establish a relationship with a primary care physician if you do not have one) for your aftercare needs so that they can reassess your need for medications and monitor your lab values.  Today   CHIEF COMPLAINT:   Chief Complaint  Patient presents with  . Abdominal Pain    HISTORY OF PRESENT ILLNESS:  The patient presents emergency department complaining of abdominal pain. He denies fevers, nausea or vomiting. He admits to generalized weakness and malaise 1 day. The patient notes that the appearance of urine collected from his suprapubic catheter has darkened and is thicker. In the emergency department urine sample was grossly purulent. He was given a dose of gentamicin per sensitivities from the last urinary tract infection for which the patient was admitted to the hospital. Due to his complicated urinary tract infection and advanced age the emergency department staff called for admission.   VITAL SIGNS:  Blood pressure 114/79, pulse 53, temperature 97.9 F (36.6 C), temperature source Oral, resp. rate 18, height 6' (1.829 m), weight 92.67 kg (204 lb 4.8 oz), SpO2 99 %.  I/O:    Intake/Output Summary (Last 24 hours) at 02/12/15 1205 Last data filed at 02/12/15 0905  Gross per 24 hour  Intake   3170 ml  Output   1501 ml  Net   1669 ml    PHYSICAL EXAMINATION:  GENERAL:  81 y.o.-year-old patient lying in the bed, ill-appearing   LUNGS: Normal breath sounds bilaterally, no wheezing, rales,rhonchi or crepitation. No use of accessory muscles of respiration.  CARDIOVASCULAR: S1, S2 normal. No murmurs, rubs,  or gallops.  ABDOMEN: Soft, non-tender, non-distended. Bowel sounds present. No organomegaly or mass.  EXTREMITIES: No pedal edema, cyanosis, or clubbing.  PSYCHIATRIC: Hard of hearing, The patient is alert SKIN: No obvious rash, lesion, or ulcer. PICC line in left upper arm, suprapubic catheter in place draining thick brown urine, Foley catheter in place  draining dark thick urine  DATA REVIEW:   CBC  Recent Labs Lab 02/10/15 0049  WBC 8.9  HGB 11.8*  HCT 36.8*  PLT 157    Chemistries   Recent Labs Lab 02/10/15 0049  02/12/15 0555  NA 133*  < > 135  K 5.3*  < > 5.3*  CL 101  < > 107  CO2 25  < > 25  GLUCOSE 151*  < > 88  BUN 43*  < > 42*  CREATININE 1.70*  < > 1.64*  CALCIUM 9.3  < > 8.5*  AST 19  --   --   ALT 20  --   --   ALKPHOS 81  --   --   BILITOT 0.8  --   --   < > = values in this interval not displayed.  Cardiac Enzymes No results for input(s): TROPONINI in the last 168 hours.  Microbiology Results  Results for orders placed or performed during the hospital encounter of 02/09/15  Blood culture (routine x 2)     Status: None (Preliminary result)   Collection Time: 02/10/15  1:59 AM  Result Value Ref Range Status   Specimen Description BLOOD BLOOD LEFT FOREARM  Final   Special Requests BOTTLES DRAWN AEROBIC AND ANAEROBIC 5ML  Final   Culture NO GROWTH 2 DAYS  Final   Report Status PENDING  Incomplete  Blood culture (routine x 2)     Status: None (Preliminary result)   Collection Time: 02/10/15  1:59 AM  Result Value Ref Range Status   Specimen Description BLOOD BLOOD RIGHT FOREARM  Final   Special Requests BOTTLES DRAWN AEROBIC AND ANAEROBIC 5ML  Final   Culture NO GROWTH 2 DAYS  Final   Report Status PENDING  Incomplete  Urine culture     Status: None (Preliminary result)   Collection Time: 02/10/15  2:20 AM  Result Value Ref Range Status   Specimen Description URINE, CLEAN CATCH  Final   Special Requests NONE  Final   Culture   Final    80,000  COLONIES/ml YEAST IDENTIFICATION TO FOLLOW    Report Status PENDING  Incomplete    RADIOLOGY:  No results found.  EKG:   Orders placed or performed during the hospital encounter of 10/20/14  . ED EKG  . ED EKG  . EKG 12-Lead  . EKG 12-Lead  . EKG      Management plans discussed with the patient, family and they are in agreement.  CODE STATUS:     Code Status Orders        Start     Ordered   02/10/15 0535  Do not attempt resuscitation (DNR)   Continuous    Question Answer Comment  In the event of cardiac or respiratory ARREST Do not call a "code blue"   In the event of cardiac or respiratory ARREST Do not perform Intubation, CPR, defibrillation or ACLS   In the event of cardiac or respiratory ARREST Use medication by any route, position, wound care, and other measures to relive pain and suffering. May use oxygen, suction and manual treatment of airway obstruction as needed for comfort.      02/10/15 0534    Advance Directive Documentation        Most Recent Value   Type of Advance Directive  Healthcare Power of Attorney, Living will   Pre-existing out of facility DNR order (yellow form or pink MOST form)     "MOST" Form in  Place?        TOTAL TIME TAKING CARE OF THIS PATIENT: 40 minutes.  Greater than 50% of time spent in care coordination and counseling.  Myrtis Ser M.D on 02/12/2015 at 12:05 PM  Between 7am to 6pm - Pager - [phone]  After 6pm go to [email or web address] - password [email or web address] [facility]  Office  [phone]  CC: Primary care physician; [name]., MD

## 2015-02-11 NOTE — Progress Notes (Signed)
ANTIBIOTIC CONSULT NOTE - INITIAL  Pharmacy Consult for Ertapenem Indication: ESBL UTI  Allergies  Allergen Reactions  . Ambien [Zolpidem Tartrate] Other (See Comments)    Reaction:  Altered mental status   . Morphine And Related Other (See Comments)    Reaction:  Altered mental status   . Penicillins Other (See Comments)    Reaction:  Unknown     Patient Measurements:  Vital Signs: Temp: 97.7 F (36.5 C) (11/18 0840) Temp Source: Oral (11/18 0840) BP: 110/54 mmHg (11/18 0903) Pulse Rate: 99 (11/18 0903)  Labs:  Recent Labs  02/10/15 0049 02/10/15 0803  WBC 8.9  --   HGB 11.8*  --   PLT 157  --   CREATININE 1.70* 1.74*   Estimated Creatinine Clearance: 32.6 mL/min (by C-G formula based on Cr of 1.74).   Microbiology: Recent Results (from the past 720 hour(s))  Urine culture     Status: None   Collection Time: 01/24/15  9:00 PM  Result Value Ref Range Status   Specimen Description URINE, CLEAN CATCH  Final   Special Requests NONE  Final   Culture   Final    >=100,000 COLONIES/mL ESCHERICHIA COLI ESBL-EXTENDED SPECTRUM BETA LACTAMASE-THE ORGANISM IS RESISTANT TO PENICILLINS, CEPHALOSPORINS AND AZTREONAM ACCORDING TO CLSI [id number] VOL.[place]. CRITICAL RESULT CALLED TO, READ BACK BY AND VERIFIED WITH: SHIRLEY ALBERT,LPN [id number] BY JRS.    Report Status 01/29/2015 FINAL  Final   Organism ID, Bacteria ESCHERICHIA COLI  Final      Susceptibility   Escherichia coli - MIC*    AMPICILLIN >=32 RESISTANT Resistant     CEFAZOLIN >=64 RESISTANT Resistant     CEFTRIAXONE >=64 RESISTANT Resistant     CIPROFLOXACIN >=4 RESISTANT Resistant     GENTAMICIN <=1 SENSITIVE Sensitive     IMIPENEM <=0.25 SENSITIVE Sensitive     NITROFURANTOIN 128 RESISTANT Resistant     TRIMETH/SULFA >=320 RESISTANT Resistant     Extended ESBL POSITIVE Resistant     PIP/TAZO Value in next row Sensitive      SENSITIVE<=4    LEVOFLOXACIN Value in next row Resistant    RESISTANT>=8    * >=100,000 COLONIES/mL ESCHERICHIA COLI  Blood culture (routine x 2)     Status: None (Preliminary result)   Collection Time: 02/10/15  1:59 AM  Result Value Ref Range Status   Specimen Description BLOOD BLOOD LEFT FOREARM  Final   Special Requests BOTTLES DRAWN AEROBIC AND ANAEROBIC 5ML  Final   Culture NO GROWTH 1 DAY  Final   Report Status PENDING  Incomplete  Blood culture (routine x 2)     Status: None (Preliminary result)   Collection Time: 02/10/15  1:59 AM  Result Value Ref Range Status   Specimen Description BLOOD BLOOD RIGHT FOREARM  Final   Special Requests BOTTLES DRAWN AEROBIC AND ANAEROBIC 5ML  Final   Culture NO GROWTH 1 DAY  Final   Report Status PENDING  Incomplete   Assessment: [name] is a 81yo male admitted for abdominal pain and weakness 2/2 to UTI. Urine Cx from October revels ESBL organism, therefore, treating UTI empirically for the same. Pharmacy consulted to dose ertapenem in this patient.   Plan:  Initiate ertapenem 1g IV q24hrs. Patient's renal function is borderline for dose reduction, continue to monitor for changes.   Pharmacy will continue to monitor.  [name] C 02/11/2015,11:27 AM

## 2015-02-11 NOTE — Progress Notes (Addendum)
Had hoped to discharge patient today. Repeat BMP shows potassium of 5.9. One dose Kayexalate given today. Will recheck potassium this afternoon to ensure that it is decreasing. No EKG changes or arrhythmias. Anticipate discharge in the morning.    1 ESBL UTI: Assumed ESBL Escherichia coli based on culture results from 10/31. He had been treated with fosfomycin in the outpatient setting. Likely his suprapubic catheter obstruction contributed to this infection. Current urine and blood cultures are no growth to date. He is responding very well to ertapenem. He has a PICC line in place and will receive 14 days of ertapenem. We will follow culture results from this and and make changes if needed.  2 urinary retention: Has a chronic suprapubic catheter. He has been followed by urology throughout this hospitalization. He produces a large amount of sediment. Apparently the suprapubic catheter tube is too small to manage the amount of sediment he produces. He currently has a Foley catheter in place as well as the suprapubic catheter. These will remain in place until his urology follow-up appointment. Initially he had pyuria, this is improving though he still has significant amount of debris in the urine. Both tubes are draining well at this time. He will need standard catheter care with bladder flushes.  #3 mild acute renal failure secondary to ATN does have chronic kidney disease stage III: Improving with gentle hydration. He will need repeat BMP on Monday 11/21. Encourage by mouth intake.  #4 constipation and abdominal pain: Will need to have BM prior to discharge. Will need to monitor closely at skilled nursing due to chronic opiate medications.  #4 diabetes mellitus: A1c 7.7. He has been well controlled in the outpatient setting. Has needed minimal sliding scale. He will continue on Lantus but on a slightly lower dose than his prior dose (new dose is 10 units) continue linagliptin. Monitor blood sugars each  morning.  #5 chronic atrial fibrillation: Controlled continue metoprolol,Eliquis.  6.depression/anxiety: Stable  7. Stage I pressure ulcers over both buttocks: These appear to be healing well. He needs every 2 hour repositioning. Needs wound care and minimization of pressure over this area.  Hyperkalemia: Kayexalate given  PHYSICAL EXAMINATION:  GENERAL: 81 y.o.-year-old patient lying in the bed, ill-appearing  LUNGS: Normal breath sounds bilaterally, no wheezing, rales,rhonchi or crepitation. No use of accessory muscles of respiration.  CARDIOVASCULAR: S1, S2 normal. No murmurs, rubs, or gallops.  ABDOMEN: Soft, non-tender, non-distended. Bowel sounds present. No organomegaly or mass.  EXTREMITIES: No pedal edema, cyanosis, or clubbing.  PSYCHIATRIC: Hard of hearing, The patient is alert SKIN: No obvious rash, lesion, or ulcer. PICC line in left upper arm, suprapubic catheter in place draining thick brown urine, Foley catheter in place draining dark thick urine

## 2015-02-11 NOTE — Progress Notes (Signed)
No events overnight Urine clearing No SP pain No f/c/n/v  Filed Vitals:   02/10/15 2147 02/11/15 0524 02/11/15 0840 02/11/15 0903  BP:  95/50 110/54 110/54  Pulse: 81 66 99 99  Temp:  98 F (36.7 C) 97.7 F (36.5 C)   TempSrc:  Oral Oral   Resp:  18 18   Height:      Weight:  211 lb 9.6 oz (95.981 kg)    SpO2: 95% 99% 93%    I/O last 3 completed shifts: In: 1468.3 [P.O.:360; I.V.:1108.3] Out: 650 [Urine:650] Total I/O In: 1754.2 [I.V.:1754.2] Out: -    NAD Soft NT ND  SP tube - 16 Fr - clear urine output Foley - 20 Fr - clear uine  Labs pending  Urine Cx pending  1. Pyocystitis Resolving.  Urine is now clear yellow from both SP tube/foley.   -Continue foley/SP tube.  -Would recommend flushing BID with 120 cc of NS as outpatient.  -Continue abx for ESBL UTI.  -OK for d/c home from Urology standpoint with both SP tube/foley -Will plan to upsize his SP tube to a larger size when his infection resolves as it continues to occlude from debris  2. Urinary retention As above

## 2015-02-11 NOTE — Clinical Social Work Note (Signed)
Pt is ready for discharge today and will return to [facility]. Facility has received discharge information and is ready to admit pt. Pt and daughter are agreeable to discharge plan. RN will call report. Family will provide transportation. CSW is signing off as no further needs identified.   Darden Dates, MSW, LCSW Clinical Social Worker  [phone]

## 2015-02-11 NOTE — NC FL2 (Signed)
[place] LEVEL OF CARE SCREENING TOOL     IDENTIFICATION  Patient Name: [name] Birthdate: Mar 19, 1921 Sex: male Admission Date (Current Location): 02/09/2015  [place] and [place] Number: Engineering geologist and Address:  [facility], [address], [place], [place] [address]      Provider Number: [id number]  Attending Physician Name and Address:  Aldean Jewett, MD  Relative Name and Phone Number:       Current Level of Care: Hospital Recommended Level of Care: [place] Prior Approval Number:    Date Approved/Denied:   PASRR Number: [id number]  Discharge Plan: SNF    Current Diagnoses: Patient Active Problem List   Diagnosis Date Noted  . ESBL (extended spectrum beta-lactamase) producing bacteria infection 02/11/2015  . Complicated UTI (urinary tract infection) 02/10/2015  . Urinary retention 02/02/2015  . Recurrent UTI 02/02/2015  . Pressure ulcer 10/22/2014  . UTI (lower urinary tract infection) 10/21/2014  . Chronic systolic heart failure ([place]) 08/02/2014  . Diabetes ([place]) 08/02/2014  . Hypotension 08/02/2014  . Bladder outlet obstruction 08/02/2014  . Neuritis or radiculitis due to rupture of lumbar intervertebral disc 07/02/2014  . Lumbar canal stenosis 07/02/2014  . Abdominal aortic atherosclerosis ([place]) 02/21/2014  . Diabetic peripheral neuropathy ([place]) 10/30/2013  . A-fib ([place]) 08/22/2013  . Arteriosclerosis of coronary artery 08/22/2013  . Accelerated essential hypertension 08/22/2013  . HLD (hyperlipidemia) 07/26/2013  . Chronic kidney disease (CKD), stage III (moderate) 08/20/2012    Orientation ACTIVITIES/SOCIAL BLADDER RESPIRATION    Self, Time, Situation  Passive Incontinent, Indwelling catheter (Foley and Superpubic) Normal  BEHAVIORAL SYMPTOMS/MOOD NEUROLOGICAL BOWEL NUTRITION STATUS      Continent Diet (heart healthy/carb modified, thin liquids)  PHYSICIAN VISITS  COMMUNICATION OF NEEDS Height & Weight Skin  30 days Verbally     Normal          AMBULATORY STATUS RESPIRATION    Assist extensive Normal      Personal Care Assistance Level of Assistance  Bathing, Feeding, Dressing Bathing Assistance: Maximum assistance Feeding assistance: Maximum assistance Dressing Assistance: Maximum assistance      Functional Limitations Info  Hearing   Hearing Info: Impaired         SPECIAL CARE FACTORS FREQUENCY                      Additional Factors Info  Allergies, Code Status Code Status Info: DNR Allergies Info: Allergies: Ambien, Morphine And Related, Penicillins           Current Medications (02/11/2015): Current Facility-Administered Medications  Medication Dose Route Frequency Provider Last Rate Last Dose  . 0.9 %  sodium chloride infusion   Intravenous Continuous Harrie Foreman, MD 125 mL/hr at 02/11/15 [phone]    . acetaminophen (TYLENOL) tablet 650 mg  650 mg Oral Q6H PRN Harrie Foreman, MD   650 mg at 02/11/15 0023   Or  . acetaminophen (TYLENOL) suppository 650 mg  650 mg Rectal Q6H PRN Harrie Foreman, MD      . apixaban Arne [facility]) tablet 2.5 mg  2.5 mg Oral BID Harrie Foreman, MD   2.5 mg at 02/11/15 [id number]  . docusate sodium (COLACE) capsule 100 mg  100 mg Oral BID Harrie Foreman, MD   100 mg at 02/11/15 [id number]  . ertapenem (INVANZ) 1 g in sodium chloride 0.9 % 50 mL IVPB  1 g Intravenous Q24H Vena Rua, RPH   1 g  at 02/11/15 0905  . feeding supplement (GLUCERNA SHAKE) (GLUCERNA SHAKE) liquid 237 mL  237 mL Oral TID BM Harrie Foreman, MD   237 mL at 02/11/15 1000  . folic acid (FOLVITE) tablet 0.5 mg  500 mcg Oral Daily Harrie Foreman, MD   0.5 mg at 02/11/15 [id number]  . fosfomycin (MONUROL) packet 3 g  3 g Oral Once Harrie Foreman, MD      . insulin aspart (novoLOG) injection 0-9 Units  0-9 Units Subcutaneous TID WC Harrie Foreman, MD   1 Units at 02/10/15 1711  . insulin glargine (LANTUS)  injection 8 Units  8 Units Subcutaneous QHS Harrie Foreman, MD   8 Units at 02/11/15 0022  . linagliptin (TRADJENTA) tablet 5 mg  5 mg Oral Daily Harrie Foreman, MD   5 mg at 02/11/15 [id number]  . loperamide (IMODIUM) capsule 2-4 mg  2-4 mg Oral Q3H PRN Harrie Foreman, MD      . LORazepam (ATIVAN) tablet 0.5 mg  0.5 mg Oral Q4H PRN Harrie Foreman, MD      . magnesium hydroxide (MILK OF MAGNESIA) suspension 30 mL  30 mL Oral Daily Harrie Foreman, MD   30 mL at 02/11/15 0905  . meclizine (ANTIVERT) tablet 25 mg  25 mg Oral TID PRN Harrie Foreman, MD      . metoprolol succinate (TOPROL-XL) 24 hr tablet 100 mg  100 mg Oral Daily Harrie Foreman, MD   100 mg at 02/11/15 0904  . multivitamin-lutein (OCUVITE-LUTEIN) capsule 1 capsule  1 capsule Oral BID Harrie Foreman, MD   1 capsule at 02/11/15 [id number]  . ondansetron (ZOFRAN) tablet 4 mg  4 mg Oral Q6H PRN Harrie Foreman, MD       Or  . ondansetron [facility]) injection 4 mg  4 mg Intravenous Q6H PRN Harrie Foreman, MD      . oxybutynin [facility]) tablet 5 mg  5 mg Oral BID Harrie Foreman, MD   5 mg at 02/11/15 [id number]  . oxyCODONE (Oxy IR/ROXICODONE) immediate release tablet 5 mg  5 mg Oral Q4H PRN Harrie Foreman, MD   5 mg at 02/10/15 0846  . phenazopyridine (PYRIDIUM) tablet 200 mg  200 mg Oral TID PRN Harrie Foreman, MD   200 mg at 02/10/15 1330  . senna-docusate (Senokot-S) tablet 1 tablet  1 tablet Oral BID Harrie Foreman, MD   1 tablet at 02/11/15 [phone]  . silver sulfADIAZINE (SILVADENE) 1 % cream 1 application  1 application Topical Daily Harrie Foreman, MD   1 application at [id number]  . simvastatin (ZOCOR) tablet 40 mg  40 mg Oral QHS Harrie Foreman, MD   40 mg at 02/11/15 0022  . traMADol (ULTRAM) tablet 50 mg  50 mg Oral Q6H PRN Harrie Foreman, MD   50 mg at 02/10/15 1214  . traZODone (DESYREL) tablet 100 mg  100 mg Oral QHS Harrie Foreman, MD   100 mg at 02/11/15 0022  . venlafaxine XR (EFFEXOR-XR)  24 hr capsule 37.5 mg  37.5 mg Oral Q breakfast Harrie Foreman, MD   37.5 mg at 02/11/15 0905  . vitamin B-12 (CYANOCOBALAMIN) tablet 1,000 mcg  1,000 mcg Oral Daily Harrie Foreman, MD   1,000 mcg at 02/11/15 [id number]   Do not use this list as official medication orders. Please verify with discharge summary.  Discharge Medications:   Medication List  STOP taking these medications        MONUROL 3 G Pack  Generic drug:  fosfomycin      TAKE these medications        acetaminophen 325 MG tablet  Commonly known as:  TYLENOL  Take 650 mg by mouth every 4 (four) hours as needed for mild pain, fever or headache.     apixaban 2.5 MG Tabs tablet  Commonly known as:  ELIQUIS  Take 2.5 mg by mouth 2 (two) times daily.     ertapenem 1 g in sodium chloride 0.9 % 50 mL  Inject 1 g into the vein daily.     feeding supplement (GLUCERNA SHAKE) Liqd  Take 237 mLs by mouth 3 (three) times daily between meals.     folic acid [id number] MCG tablet  Commonly known as:  FOLVITE  Take 400 mcg by mouth daily.     insulin glargine 100 UNIT/ML injection  Commonly known as:  LANTUS  Inject 0.1 mLs (10 Units total) into the skin at bedtime.     loperamide 2 MG capsule  Commonly known as:  IMODIUM  Take 2-4 mg by mouth every 3 (three) hours as needed for diarrhea or loose stools.     LORazepam 0.5 MG tablet  Commonly known as:  ATIVAN  Take 0.5 mg by mouth every 4 (four) hours as needed for anxiety.     meclizine 25 MG tablet  Commonly known as:  ANTIVERT  Take 25 mg by mouth 3 (three) times daily as needed for dizziness.     metoprolol succinate 100 MG 24 hr tablet  Commonly known as:  TOPROL-XL  Take 100 mg by mouth daily.     MILK OF MAGNESIA PO  Take 30 mLs by mouth daily.     naproxen 500 MG tablet  Commonly known as:  NAPROSYN  Take 500 mg by mouth 2 (two) times daily with a meal.     oxybutynin 5 MG tablet  Commonly known as:  DITROPAN  Take 5 mg by mouth 2 (two) times daily.      oxyCODONE 5 MG immediate release tablet  Commonly known as:  Oxy IR/ROXICODONE  Take 1 tablet (5 mg total) by mouth every 4 (four) hours as needed for severe pain.     phenazopyridine 200 MG tablet  Commonly known as:  PYRIDIUM  Take 1 tablet (200 mg total) by mouth 3 (three) times daily as needed for pain.     PRESERVISION AREDS 2 PO  Take 1 capsule by mouth 2 (two) times daily.     THEREMS-M PO  Take 1 tablet by mouth daily.     SENNA S 8.6-50 MG tablet  Generic drug:  senna-docusate  Take 1 tablet by mouth 2 (two) times daily.     silver sulfADIAZINE 1 % cream  Commonly known as:  SILVADENE  Apply 1 application topically daily.     simvastatin 40 MG tablet  Commonly known as:  ZOCOR  Take 40 mg by mouth at bedtime.     sitaGLIPtin 50 MG tablet  Commonly known as:  JANUVIA  Take 50 mg by mouth daily.     torsemide 5 MG tablet  Commonly known as:  DEMADEX  Take 5 mg by mouth daily.     traMADol 50 MG tablet  Commonly known as:  ULTRAM  Take 50 mg by mouth every 6 (six) hours as needed for moderate pain.     traZODone 100 MG  tablet  Commonly known as:  DESYREL  Take 100 mg by mouth at bedtime.     venlafaxine XR 37.5 MG 24 hr capsule  Commonly known as:  EFFEXOR-XR  Take 37.5 mg by mouth daily with breakfast.     vitamin B-12 1000 MCG tablet  Commonly known as:  CYANOCOBALAMIN  Take 1,000 mcg by mouth daily.        Relevant Imaging Results:  Relevant Lab Results:  Recent Labs    Additional Information    Darden Dates, LCSW

## 2015-02-12 ENCOUNTER — Observation Stay: Payer: Medicare Other

## 2015-02-12 DIAGNOSIS — N390 Urinary tract infection, site not specified: Secondary | ICD-10-CM | POA: Diagnosis not present

## 2015-02-12 LAB — BASIC METABOLIC PANEL
Anion gap: 3 — ABNORMAL LOW (ref 5–15)
BUN: 42 mg/dL — AB (ref 6–20)
CALCIUM: 8.5 mg/dL — AB (ref 8.9–10.3)
CO2: 25 mmol/L (ref 22–32)
CREATININE: 1.64 mg/dL — AB (ref 0.61–1.24)
Chloride: 107 mmol/L (ref 101–111)
GFR calc Af Amer: 40 mL/min — ABNORMAL LOW (ref >60)
GFR calc non Af Amer: 35 mL/min — ABNORMAL LOW (ref >60)
GLUCOSE: 88 mg/dL (ref 65–99)
Potassium: 5.3 mmol/L — ABNORMAL HIGH (ref 3.5–5.1)
Sodium: 135 mmol/L (ref 135–145)

## 2015-02-12 LAB — GLUCOSE, CAPILLARY
Glucose-Capillary: 75 mg/dL (ref 65–99)
Glucose-Capillary: 148 mg/dL — ABNORMAL HIGH (ref 65–99)
Glucose-Capillary: 181 mg/dL — ABNORMAL HIGH (ref 65–99)

## 2015-02-12 MED ORDER — FUROSEMIDE 10 MG/ML IJ SOLN
20.0000 mg | Freq: Once | INTRAMUSCULAR | Status: AC
  Administered 2015-02-12: 14:00:00 20 mg via INTRAVENOUS
  Filled 2015-02-12: qty 2

## 2015-02-12 MED ORDER — SODIUM POLYSTYRENE SULFONATE 15 GM/60ML PO SUSP
15.0000 g | Freq: Once | ORAL | Status: AC
  Administered 2015-02-12: 10:00:00 15 g via ORAL
  Filled 2015-02-12: qty 60

## 2015-02-12 NOTE — Progress Notes (Addendum)
Clinical Social Worker informed by Aldean Jewett, MD that patient is medically ready to discharge back [facility], Patient and family are in a agreement with plan.  [facility] confirmed that patient's bed was ready yesterday.  Patient's daughter Opal Sidles will transport patient to [facility] and will take the discharge packet. Discharge information faxed to facility and RN will call report.  Casimer Lanius. Latanya Presser, MSW Clinical Social Work Department [phone] 12:06 PM

## 2015-02-12 NOTE — Plan of Care (Signed)
Problem: Skin Integrity: Goal: Risk for impaired skin integrity will decrease Outcome: Progressing Pt has reported pain once during shift. Pt had an episode where he seem confused about the time of day and taking his medication. Pt became calm and rested comfortably the rest of the shift. No other signs of distress noted. Will continue to monitor.

## 2015-02-13 LAB — URINE CULTURE

## 2015-02-14 ENCOUNTER — Telehealth: Payer: Self-pay

## 2015-02-14 DIAGNOSIS — R5381 Other malaise: Secondary | ICD-10-CM | POA: Diagnosis present

## 2015-02-14 DIAGNOSIS — R05 Cough: Secondary | ICD-10-CM | POA: Diagnosis present

## 2015-02-14 DIAGNOSIS — N183 Chronic kidney disease, stage 3 (moderate): Secondary | ICD-10-CM | POA: Diagnosis present

## 2015-02-14 LAB — BASIC METABOLIC PANEL
Anion gap: 7 (ref 5–15)
BUN: 31 mg/dL — AB (ref 6–20)
CALCIUM: 8.9 mg/dL (ref 8.9–10.3)
CO2: 25 mmol/L (ref 22–32)
CREATININE: 1.35 mg/dL — AB (ref 0.61–1.24)
Chloride: 103 mmol/L (ref 101–111)
GFR calc Af Amer: 51 mL/min — ABNORMAL LOW (ref >60)
GFR, EST NON AFRICAN AMERICAN: 44 mL/min — AB (ref >60)
Glucose, Bld: 155 mg/dL — ABNORMAL HIGH (ref 65–99)
Potassium: 5.6 mmol/L — ABNORMAL HIGH (ref 3.5–5.1)
SODIUM: 135 mmol/L (ref 135–145)

## 2015-02-14 LAB — GLUCOSE, CAPILLARY
GLUCOSE-CAPILLARY: 174 mg/dL — AB (ref 65–99)
GLUCOSE-CAPILLARY: 221 mg/dL — AB (ref 65–99)

## 2015-02-14 NOTE — Telephone Encounter (Signed)
Pt stated he was advised to complete abx and have foley removed prior to appt on the 03/09/15. Please advise.

## 2015-02-14 NOTE — Telephone Encounter (Signed)
I plan to remove his foley at his next appointment and upsize his SP tube at that time.

## 2015-02-14 NOTE — Telephone Encounter (Signed)
I will take care of this ° °[name] °

## 2015-02-14 NOTE — Telephone Encounter (Signed)
-----   Message from Nickie Retort, MD sent at 02/11/2015 11:04 AM EST ----- Patient needs to change his f/u appt to me in early December instead of [facility].  We will upsize his SP tube at that time.  Will need urethral dialtors, guide wire

## 2015-02-14 NOTE — Telephone Encounter (Signed)
I have schd him to see Dr. Pilar Jarvis on 03-09-15 per his request.  Thanks, Sharyn Lull

## 2015-02-15 DIAGNOSIS — N183 Chronic kidney disease, stage 3 (moderate): Secondary | ICD-10-CM | POA: Diagnosis not present

## 2015-02-15 LAB — CBC WITH DIFFERENTIAL/PLATELET
Basophils Absolute: 0 10*3/uL (ref 0–0.1)
Basophils Relative: 0 %
EOS ABS: 0.3 10*3/uL (ref 0–0.7)
EOS PCT: 3 %
HCT: 33.3 % — ABNORMAL LOW (ref 40.0–52.0)
HEMOGLOBIN: 10.7 g/dL — AB (ref 13.0–18.0)
LYMPHS ABS: 0.9 10*3/uL — AB (ref 1.0–3.6)
LYMPHS PCT: 12 %
MCH: 29.2 pg (ref 26.0–34.0)
MCHC: 32.2 g/dL (ref 32.0–36.0)
MCV: 90.6 fL (ref 80.0–100.0)
MONOS PCT: 9 %
Monocytes Absolute: 0.7 10*3/uL (ref 0.2–1.0)
Neutro Abs: 6.1 10*3/uL (ref 1.4–6.5)
Neutrophils Relative %: 76 %
PLATELETS: 165 10*3/uL (ref 150–440)
RBC: 3.67 MIL/uL — ABNORMAL LOW (ref 4.40–5.90)
RDW: 15.6 % — ABNORMAL HIGH (ref 11.5–14.5)
WBC: 8.1 10*3/uL (ref 3.8–10.6)

## 2015-02-15 LAB — COMPREHENSIVE METABOLIC PANEL
ALK PHOS: 84 U/L (ref 38–126)
ALT: 18 U/L (ref 17–63)
ANION GAP: 4 — AB (ref 5–15)
AST: 19 U/L (ref 15–41)
Albumin: 2.4 g/dL — ABNORMAL LOW (ref 3.5–5.0)
BUN: 28 mg/dL — ABNORMAL HIGH (ref 6–20)
CALCIUM: 8.3 mg/dL — AB (ref 8.9–10.3)
CO2: 27 mmol/L (ref 22–32)
CREATININE: 1.45 mg/dL — AB (ref 0.61–1.24)
Chloride: 102 mmol/L (ref 101–111)
GFR, EST AFRICAN AMERICAN: 47 mL/min — AB (ref >60)
GFR, EST NON AFRICAN AMERICAN: 40 mL/min — AB (ref >60)
Glucose, Bld: 137 mg/dL — ABNORMAL HIGH (ref 65–99)
Potassium: 5.1 mmol/L (ref 3.5–5.1)
SODIUM: 133 mmol/L — AB (ref 135–145)
TOTAL PROTEIN: 5.6 g/dL — AB (ref 6.5–8.1)
Total Bilirubin: 0.6 mg/dL (ref 0.3–1.2)

## 2015-02-15 LAB — GLUCOSE, CAPILLARY
GLUCOSE-CAPILLARY: 243 mg/dL — AB (ref 65–99)
Glucose-Capillary: 107 mg/dL — ABNORMAL HIGH (ref 65–99)

## 2015-02-16 LAB — CULTURE, BLOOD (ROUTINE X 2)
Culture: NO GROWTH
Culture: NO GROWTH

## 2015-02-16 LAB — GLUCOSE, CAPILLARY: GLUCOSE-CAPILLARY: 125 mg/dL — AB (ref 65–99)

## 2015-02-17 LAB — GLUCOSE, CAPILLARY
Glucose-Capillary: 117 mg/dL — ABNORMAL HIGH (ref 65–99)
Glucose-Capillary: 202 mg/dL — ABNORMAL HIGH (ref 65–99)

## 2015-02-18 LAB — GLUCOSE, CAPILLARY: GLUCOSE-CAPILLARY: 254 mg/dL — AB (ref 65–99)

## 2015-02-19 LAB — GLUCOSE, CAPILLARY
Glucose-Capillary: 129 mg/dL — ABNORMAL HIGH (ref 65–99)
Glucose-Capillary: 107 mg/dL — ABNORMAL HIGH (ref 65–99)

## 2015-02-20 LAB — GLUCOSE, CAPILLARY
Glucose-Capillary: 101 mg/dL — ABNORMAL HIGH (ref 65–99)
Glucose-Capillary: 140 mg/dL — ABNORMAL HIGH (ref 65–99)

## 2015-02-21 LAB — GLUCOSE, CAPILLARY: GLUCOSE-CAPILLARY: 108 mg/dL — AB (ref 65–99)

## 2015-02-24 DEATH — deceased

## 2015-03-03 ENCOUNTER — Ambulatory Visit: Payer: Medicare Other | Admitting: Urology

## 2015-03-07 ENCOUNTER — Ambulatory Visit: Payer: Medicare Other | Admitting: Urology

## 2015-03-09 ENCOUNTER — Ambulatory Visit: Payer: Medicare Other

## 2017-04-02 IMAGING — CT CT T SPINE W/O CM
3 series · 10 of 33 positions shown, 12 images · non-contrast
Comparison: None.

CLINICAL DATA: Leg weakness, left worse than right.

EXAM:
CT THORACIC SPINE WITHOUT CONTRAST
TECHNIQUE: Multidetector CT imaging of the thoracic spine was performed without
intravenous contrast administration. Multiplanar CT image
reconstructions were also generated.

[Series 4: soft tissue · axial · 0.32mm/px · z∈[-493,-343]mm · 2 of 164 slices shown, 3 images]
[im 51/164  soft-tissue]
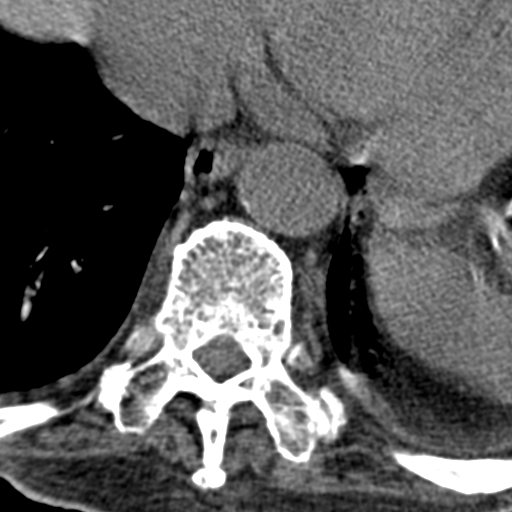
[im 51/164  bone]
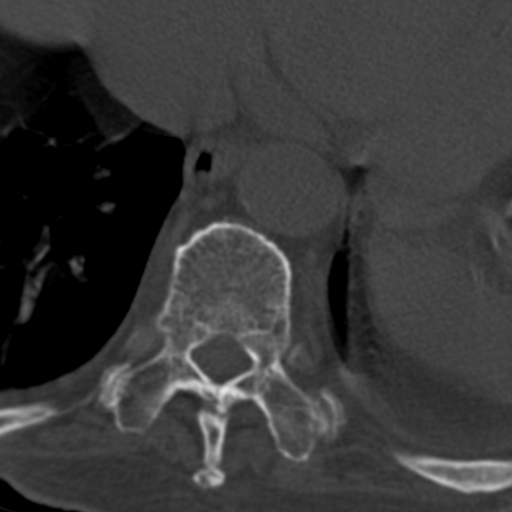
[im 126/164  bone]
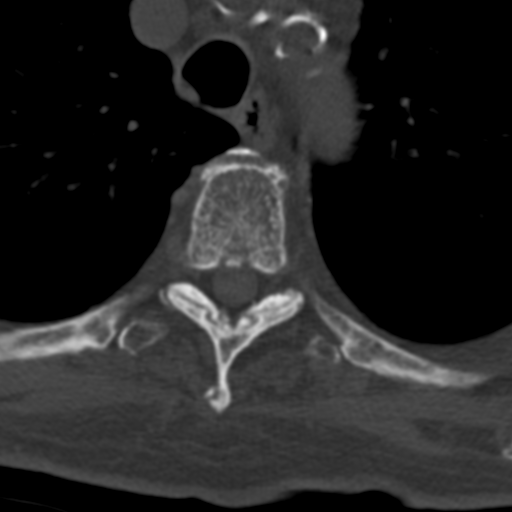

[Series 7: sagittal bone · sagittal · 0.33mm/px · 5 of 49 slices shown, 6 images]
[im 17/49  bone]
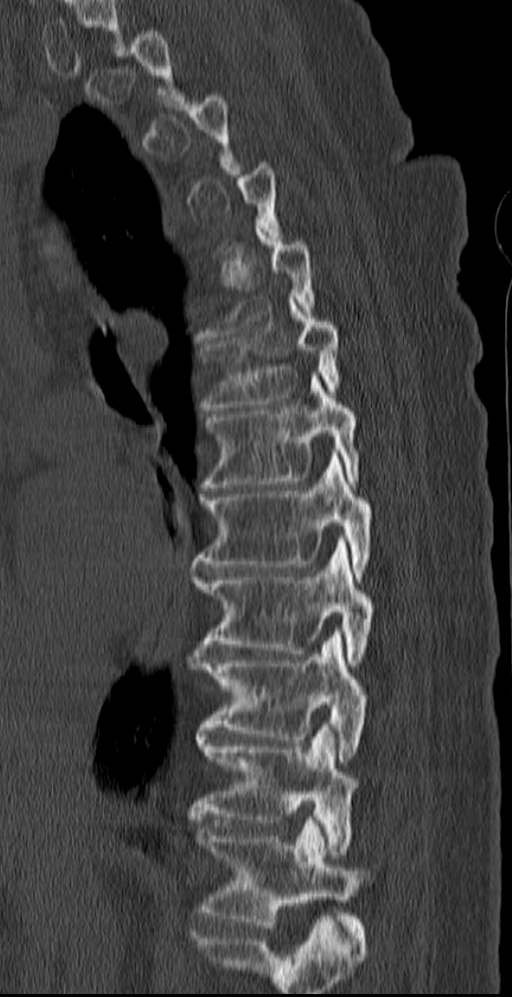
[im 21/49  bone]
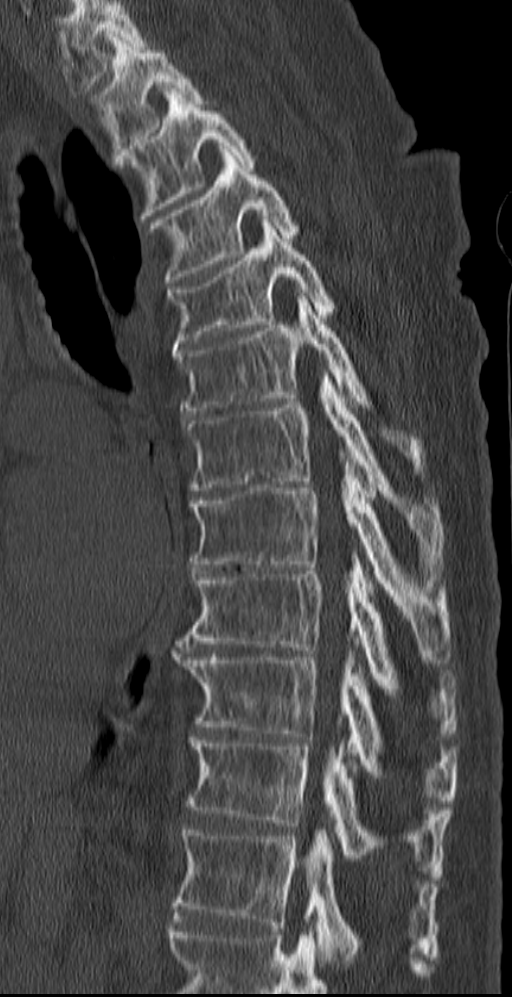
[im 25/49  soft-tissue]
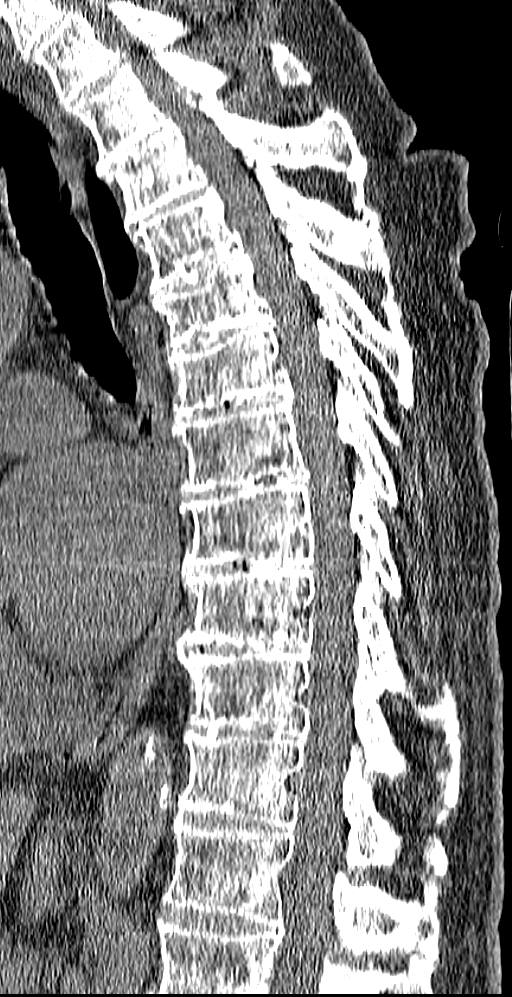
[im 25/49  bone]
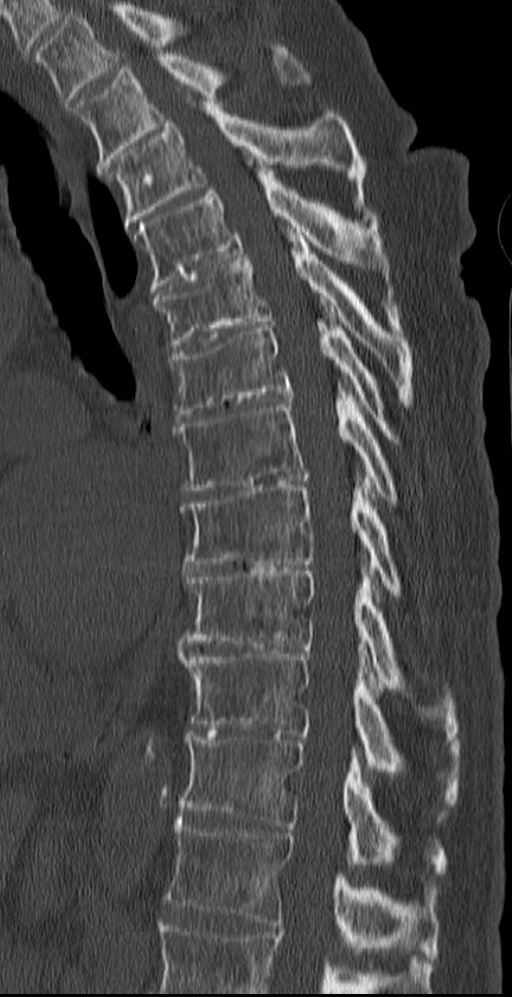
[im 29/49  bone]
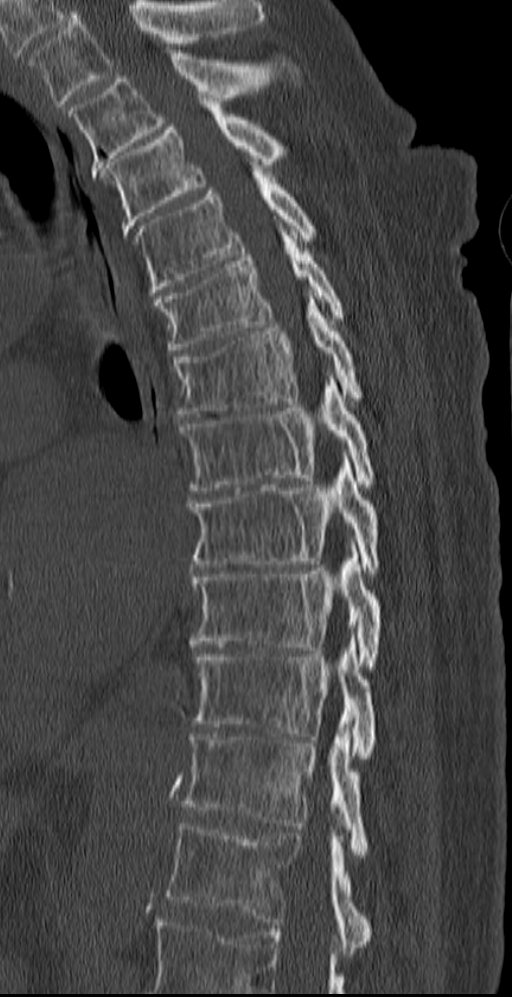
[im 33/49  bone]
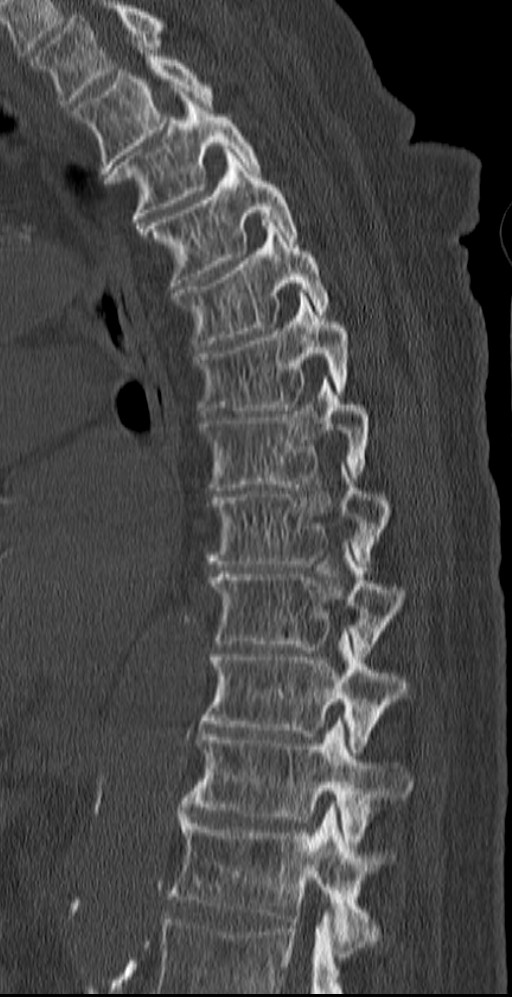

[Series 8: coronal bone · coronal · 0.33mm/px · 3 of 78 slices shown]
[im 16/78  bone]
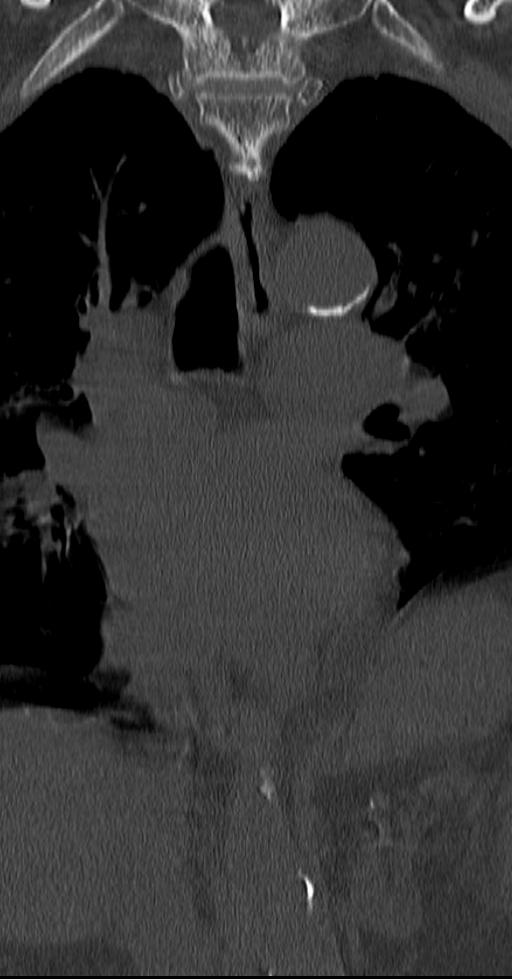
[im 31/78  bone]
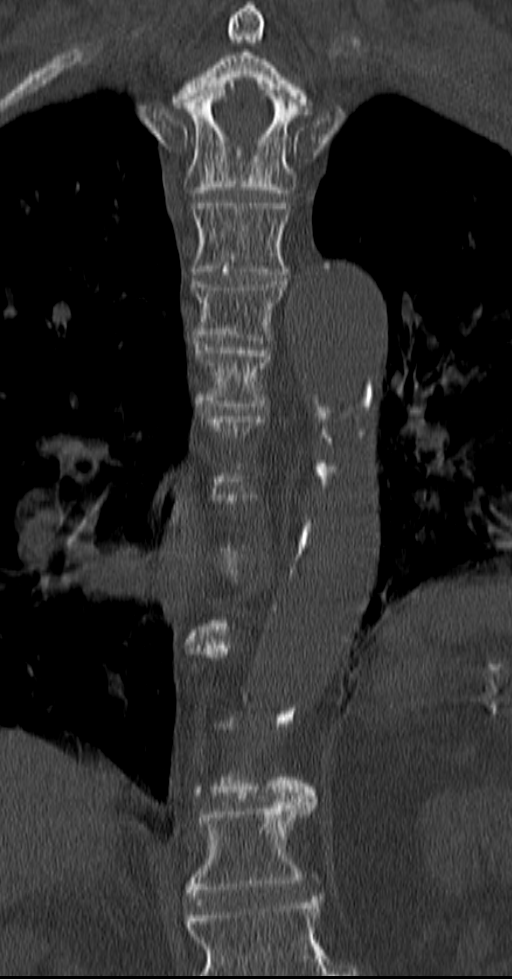
[im 47/78  bone]
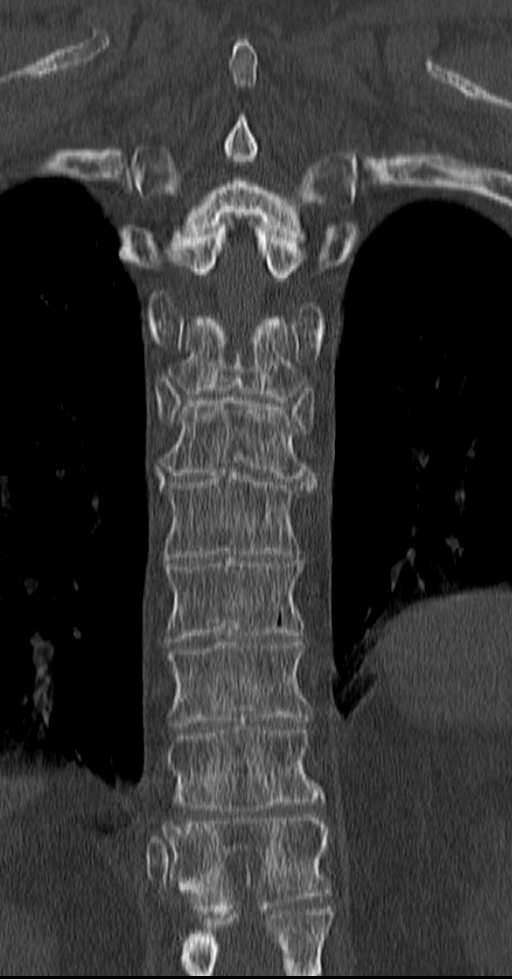

[10 of 33 positions shown; findings below may reference images not displayed]

FINDINGS: The thoracic vertebral column is intact. There is no evidence of
acute fracture. There is no bone lesion or bony destruction.
Posterior elements are unremarkable. There are mild degenerative
disc changes. The central canal is patent throughout the thoracic
spine. No paraspinal abnormalities are evident.
IMPRESSION: No bone lesion, bony destruction or fracture. No significant
encroachment on the central canal by bone or soft tissue. No acute
findings are evident.
